# Patient Record
Sex: Female | Born: 1975 | Race: Black or African American | Hispanic: No | Marital: Single | Smoking: Never smoker
Health system: Southern US, Community
[De-identification: ages and names within clinical notes are randomized; demographics above are authoritative.]

## PROBLEM LIST (undated history)

## (undated) ENCOUNTER — Inpatient Hospital Stay (HOSPITAL_COMMUNITY): Payer: Self-pay

## (undated) DIAGNOSIS — Z789 Other specified health status: Secondary | ICD-10-CM

## (undated) HISTORY — PX: KNEE ARTHROSCOPY WITH ANTERIOR CRUCIATE LIGAMENT (ACL) REPAIR: SHX5644

## (undated) HISTORY — PX: WISDOM TOOTH EXTRACTION: SHX21

---

## 2015-12-06 ENCOUNTER — Emergency Department (HOSPITAL_COMMUNITY)
Admission: EM | Admit: 2015-12-06 | Discharge: 2015-12-07 | Disposition: A | Discharge status: Home / Self Care | Payer: Self-pay | Attending: Emergency Medicine | Admitting: Emergency Medicine

## 2015-12-06 ENCOUNTER — Emergency Department (HOSPITAL_COMMUNITY): Payer: Self-pay

## 2015-12-06 ENCOUNTER — Encounter (HOSPITAL_COMMUNITY): Payer: Self-pay | Admitting: *Deleted

## 2015-12-06 DIAGNOSIS — Z79899 Other long term (current) drug therapy: Secondary | ICD-10-CM | POA: Insufficient documentation

## 2015-12-06 DIAGNOSIS — Z3A15 15 weeks gestation of pregnancy: Secondary | ICD-10-CM | POA: Insufficient documentation

## 2015-12-06 DIAGNOSIS — Z3201 Encounter for pregnancy test, result positive: Secondary | ICD-10-CM

## 2015-12-06 DIAGNOSIS — O219 Vomiting of pregnancy, unspecified: Secondary | ICD-10-CM | POA: Insufficient documentation

## 2015-12-06 DIAGNOSIS — Z5181 Encounter for therapeutic drug level monitoring: Secondary | ICD-10-CM | POA: Insufficient documentation

## 2015-12-06 DIAGNOSIS — Z3491 Encounter for supervision of normal pregnancy, unspecified, first trimester: Secondary | ICD-10-CM

## 2015-12-06 LAB — COMPREHENSIVE METABOLIC PANEL
ALBUMIN: 3.9 g/dL (ref 3.5–5.0)
ALT: 13 U/L — AB (ref 14–54)
AST: 16 U/L (ref 15–41)
Alkaline Phosphatase: 34 U/L — ABNORMAL LOW (ref 38–126)
Anion gap: 8 (ref 5–15)
BILIRUBIN TOTAL: 1 mg/dL (ref 0.3–1.2)
CHLORIDE: 103 mmol/L (ref 101–111)
CO2: 23 mmol/L (ref 22–32)
CREATININE: 0.51 mg/dL (ref 0.44–1.00)
Calcium: 9.6 mg/dL (ref 8.9–10.3)
GFR calc Af Amer: >60 mL/min (ref >60)
GLUCOSE: 90 mg/dL (ref 65–99)
Potassium: 3.5 mmol/L (ref 3.5–5.1)
Sodium: 134 mmol/L — ABNORMAL LOW (ref 135–145)
Total Protein: 7.2 g/dL (ref 6.5–8.1)

## 2015-12-06 LAB — URINE MICROSCOPIC-ADD ON

## 2015-12-06 LAB — URINALYSIS, ROUTINE W REFLEX MICROSCOPIC
GLUCOSE, UA: NEGATIVE mg/dL
KETONES UR: 40 mg/dL — AB
LEUKOCYTES UA: NEGATIVE
Nitrite: NEGATIVE
PH: 6 (ref 5.0–8.0)
PROTEIN: 30 mg/dL — AB
Specific Gravity, Urine: 1.029 (ref 1.005–1.030)

## 2015-12-06 LAB — CBC
HEMATOCRIT: 35.6 % — AB (ref 36.0–46.0)
Hemoglobin: 12.3 g/dL (ref 12.0–15.0)
MCH: 31.5 pg (ref 26.0–34.0)
MCHC: 34.6 g/dL (ref 30.0–36.0)
MCV: 91.3 fL (ref 78.0–100.0)
PLATELETS: 387 10*3/uL (ref 150–400)
RBC: 3.9 MIL/uL (ref 3.87–5.11)
RDW: 13.2 % (ref 11.5–15.5)
WBC: 8 10*3/uL (ref 4.0–10.5)

## 2015-12-06 LAB — I-STAT BETA HCG BLOOD, ED (MC, WL, AP ONLY)

## 2015-12-06 LAB — I-STAT VENOUS BLOOD GAS, ED
Acid-base deficit: 3 mmol/L — ABNORMAL HIGH (ref 0.0–2.0)
Bicarbonate: 24.3 mEq/L — ABNORMAL HIGH (ref 20.0–24.0)
O2 SAT: 55 %
PCO2 VEN: 49.5 mmHg (ref 45.0–50.0)
TCO2: 26 mmol/L (ref 0–100)
pH, Ven: 7.299 (ref 7.250–7.300)
pO2, Ven: 32 mmHg (ref 31.0–45.0)

## 2015-12-06 LAB — RAPID URINE DRUG SCREEN, HOSP PERFORMED
Amphetamines: NOT DETECTED
BARBITURATES: NOT DETECTED
Benzodiazepines: NOT DETECTED
Cocaine: NOT DETECTED
Opiates: NOT DETECTED
Tetrahydrocannabinol: POSITIVE — AB

## 2015-12-06 LAB — I-STAT CG4 LACTIC ACID, ED: LACTIC ACID, VENOUS: 1.15 mmol/L (ref 0.5–1.9)

## 2015-12-06 LAB — LIPASE, BLOOD: LIPASE: 15 U/L (ref 11–51)

## 2015-12-06 MED ORDER — SODIUM CHLORIDE 0.9 % IV SOLN
1000.0000 mL | INTRAVENOUS | Status: DC
  Administered 2015-12-06: 1000 mL via INTRAVENOUS

## 2015-12-06 MED ORDER — ONDANSETRON HCL 4 MG/2ML IJ SOLN
4.0000 mg | Freq: Once | INTRAMUSCULAR | Status: AC
Start: 2015-12-06 — End: 2015-12-06
  Administered 2015-12-06: 4 mg via INTRAVENOUS
  Filled 2015-12-06: qty 2

## 2015-12-06 MED ORDER — SODIUM CHLORIDE 0.9 % IV SOLN: 1000.0000 mL | Freq: Once | INTRAVENOUS | Status: DC

## 2015-12-06 NOTE — ED Triage Notes (Signed)
Pt states she stop drinking a month ago and her "body in going crazy". Pt reports persistent nausea and has to smoke weed to help with the nausea. Pt believes there is something wrong with her liver and kidney. Pt denies diarrhea, blood in stools.

## 2015-12-06 NOTE — ED Provider Notes (Signed)
[facility] DEPT Provider Note   CSN: [id number] Arrival date & time: 12/06/15  [id number]     History   Chief Complaint Chief Complaint  Patient presents with  . Abdominal Pain  . Nausea    HPI Erin Gibbs is a 40 y.o. female. She relates that she was drinking heavily for many years and stopped drinking about one month ago. Since then, she has had ongoing problems with nausea and vomiting. There's also been some crampy abdominal pain. She denies constipation or diarrhea. She denies fever or chills. She does break out in sweats when she vomits. She denies using any other drugs. Last menses was 2 months ago but she states she is certain she is not pregnant. She has had urinary frequency and breast tenderness.  The history is provided by the patient.  Abdominal Pain      History reviewed. No pertinent past medical history.  There are no active problems to display for this patient.   History reviewed. No pertinent surgical history.  OB History    No data available       Home Medications    Prior to Admission medications   Not on File    Family History History reviewed. No pertinent family history.  Social History Social History  Substance Use Topics  . Smoking status: Never Smoker  . Smokeless tobacco: Never Used  . Alcohol use No     Allergies   Review of patient's allergies indicates no known allergies.   Review of Systems Review of Systems  Gastrointestinal: Positive for abdominal pain.  All other systems reviewed and are negative.    Physical Exam Updated Vital Signs BP 131/99   Pulse 88   Resp 18   SpO2 100%   Physical Exam  Nursing note and vitals reviewed.  40 year old female, resting comfortably and in no acute distress. Vital signs are significant for diastolic hypertension. Oxygen saturation is 100%, which is normal. Head is normocephalic and atraumatic. PERRLA, EOMI. Oropharynx is clear. Neck is nontender and supple without  adenopathy or JVD. Back is nontender and there is no CVA tenderness. Lungs are clear without rales, wheezes, or rhonchi. Chest is nontender. Heart has regular rate and rhythm without murmur. Abdomen is soft, flat, with mild to moderate tenderness across the lower abdomen. Uterine fundus is palpable up to the umbilicus. There are no other masses or hepatosplenomegaly and peristalsis is normoactive. Extremities have no cyanosis or edema, full range of motion is present. Skin is warm and dry without rash. Neurologic: Mental status is normal, cranial nerves are intact, there are no motor or sensory deficits.  ED Treatments / Results  Labs (all labs ordered are listed, but only abnormal results are displayed) Labs Reviewed  COMPREHENSIVE METABOLIC PANEL - Abnormal; Notable for the following:       Result Value   Sodium 134 (*)    BUN <5 (*)    ALT 13 (*)    Alkaline Phosphatase 34 (*)    All other components within normal limits  CBC - Abnormal; Notable for the following:    HCT 35.6 (*)    All other components within normal limits  URINALYSIS, ROUTINE W REFLEX MICROSCOPIC (NOT AT [facility]) - Abnormal; Notable for the following:    Color, Urine AMBER (*)    APPearance CLOUDY (*)    Hgb urine dipstick TRACE (*)    Bilirubin Urine SMALL (*)    Ketones, ur 40 (*)    Protein, ur  30 (*)    All other components within normal limits  URINE RAPID DRUG SCREEN, HOSP PERFORMED - Abnormal; Notable for the following:    Tetrahydrocannabinol POSITIVE (*)    All other components within normal limits  URINE MICROSCOPIC-ADD ON - Abnormal; Notable for the following:    Squamous Epithelial / LPF 6-30 (*)    Bacteria, UA FEW (*)    All other components within normal limits  I-STAT BETA HCG BLOOD, ED ([facility], [facility], [facility] ONLY) - Abnormal; Notable for the following:    I-stat hCG, quantitative >2,000.0 (*)    All other components within normal limits  I-STAT VENOUS BLOOD GAS, ED - Abnormal; Notable for the  following:    Bicarbonate 24.3 (*)    Acid-base deficit 3.0 (*)    All other components within normal limits  LIPASE, BLOOD  HCG, QUANTITATIVE, PREGNANCY  I-STAT CG4 LACTIC ACID, ED  I-STAT CG4 LACTIC ACID, ED    Radiology US Ob Limited  Result Date: 12/07/2015 CLINICAL DATA:  Unknown last menstrual period. Unsure dates. Positive pregnancy test. Nausea. EXAM: LIMITED OBSTETRIC ULTRASOUND FINDINGS: Number of Fetuses:  1 Heart Rate:  149 bpm Movement:  Fetal movement is observed. Presentation: Fetus is in variable presentation during the examination. Placental Location: Placenta is fundal and extends anteriorly and posteriorly. Previa: No previa. Amniotic Fluid (Subjective): Within normal limits. Amniotic fluid index is 149 cm. BPD:  3.03cm 15w  4d MATERNAL FINDINGS: Cervix:  Appears closed. Uterus/Adnexae: Limited visualization. Fibroids are demonstrated in the lower uterine segment near the cervix. Anterior fibroid measures 5.1 cm maximal diameter and posterior fibroid measures 5.6 cm maximal diameter. Ovaries are not visualized. IMPRESSION: A single intrauterine pregnancy is present. Complete fetal measurements are not obtained for dating but limited biparietal diameter measurements suggest 15 weeks 4 days gestational age. No acute complication is identified on limited imaging. Lower uterine segment fibroids. This exam is performed on an emergent basis and does not comprehensively evaluate fetal size, dating, or anatomy; follow-up complete OB US should be considered if further fetal assessment is warranted. Electronically Signed   By: Lucienne Capers M.D.   On: 12/07/2015 00:09    Procedures Procedures (including critical care time)  Medications Ordered in ED Medications - No data to display   Initial Impression / Assessment and Plan / ED Course  I have reviewed the triage vital signs and the nursing notes.  Pertinent labs & imaging results that were available during my care of the  patient were reviewed by me and considered in my medical decision making (see chart for details).  Clinical Course   Pregnancy based on positive i-STAT hCG. All symptoms are typical of pregnancy, but patient insists she is not pregnant. I'm concerned about her blood pressure. She'll be sent for ultrasound to establish gestational age. In the meantime, she is given IV fluids, ondansetron. She has no old records and the [facility] system.  Ultrasound confirms intrauterine gestation at 15 weeks, 3 days. She is referred to women's clinic for follow-up. She is given prescriptions for prenatal vitamins and ondansetron.  Final Clinical Impressions(s) / ED Diagnoses   Final diagnoses:  Positive pregnancy test  First trimester pregnancy  Nausea and vomiting during pregnancy    New Prescriptions New Prescriptions   ONDANSETRON (ZOFRAN) 4 MG TABLET    Take 1 tablet (4 mg total) by mouth every 6 (six) hours as needed for nausea or vomiting.   PRENATAL MULTIVIT-MIN-FE-FA (PRENATAL VITAMINS) 0.8 MG TABLET  Take 1 tablet by mouth daily.     Delora Fuel, MD [id number]

## 2015-12-06 NOTE — ED Notes (Signed)
Patient transported to Ultrasound 

## 2015-12-07 LAB — HCG, QUANTITATIVE, PREGNANCY: HCG, BETA CHAIN, QUANT, S: 44720 m[IU]/mL — AB (ref <5)

## 2015-12-07 MED ORDER — ONDANSETRON HCL 4 MG PO TABS: 4.0000 mg | ORAL_TABLET | Freq: Four times a day (QID) | ORAL | 0 refills | Status: DC | PRN

## 2015-12-07 MED ORDER — PRENATAL VITAMINS 0.8 MG PO TABS: 1.0000 | ORAL_TABLET | Freq: Every day | ORAL | 0 refills | Status: DC

## 2016-01-06 ENCOUNTER — Encounter (HOSPITAL_COMMUNITY): Payer: Self-pay | Admitting: *Deleted

## 2016-01-06 ENCOUNTER — Inpatient Hospital Stay (HOSPITAL_COMMUNITY)
Admission: AD | Admit: 2016-01-06 | Discharge: 2016-01-06 | Disposition: A | Discharge status: Home / Self Care | Payer: Medicaid Other | Source: Ambulatory Visit | Attending: Obstetrics & Gynecology | Admitting: Obstetrics & Gynecology

## 2016-01-06 ENCOUNTER — Inpatient Hospital Stay (HOSPITAL_COMMUNITY): Payer: Medicaid Other

## 2016-01-06 DIAGNOSIS — O26899 Other specified pregnancy related conditions, unspecified trimester: Secondary | ICD-10-CM

## 2016-01-06 DIAGNOSIS — O09512 Supervision of elderly primigravida, second trimester: Secondary | ICD-10-CM

## 2016-01-06 DIAGNOSIS — O23592 Infection of other part of genital tract in pregnancy, second trimester: Secondary | ICD-10-CM | POA: Diagnosis not present

## 2016-01-06 DIAGNOSIS — R109 Unspecified abdominal pain: Secondary | ICD-10-CM

## 2016-01-06 DIAGNOSIS — O9989 Other specified diseases and conditions complicating pregnancy, childbirth and the puerperium: Secondary | ICD-10-CM

## 2016-01-06 DIAGNOSIS — R1032 Left lower quadrant pain: Secondary | ICD-10-CM | POA: Insufficient documentation

## 2016-01-06 DIAGNOSIS — D259 Leiomyoma of uterus, unspecified: Secondary | ICD-10-CM

## 2016-01-06 DIAGNOSIS — F1210 Cannabis abuse, uncomplicated: Secondary | ICD-10-CM

## 2016-01-06 DIAGNOSIS — O26872 Cervical shortening, second trimester: Secondary | ICD-10-CM

## 2016-01-06 DIAGNOSIS — O3410 Maternal care for benign tumor of corpus uteri, unspecified trimester: Secondary | ICD-10-CM

## 2016-01-06 DIAGNOSIS — N760 Acute vaginitis: Secondary | ICD-10-CM

## 2016-01-06 DIAGNOSIS — O3412 Maternal care for benign tumor of corpus uteri, second trimester: Secondary | ICD-10-CM | POA: Insufficient documentation

## 2016-01-06 DIAGNOSIS — O99330 Smoking (tobacco) complicating pregnancy, unspecified trimester: Secondary | ICD-10-CM | POA: Diagnosis present

## 2016-01-06 DIAGNOSIS — Z5900 Homelessness unspecified: Secondary | ICD-10-CM

## 2016-01-06 DIAGNOSIS — B9689 Other specified bacterial agents as the cause of diseases classified elsewhere: Secondary | ICD-10-CM

## 2016-01-06 DIAGNOSIS — O09529 Supervision of elderly multigravida, unspecified trimester: Secondary | ICD-10-CM

## 2016-01-06 DIAGNOSIS — O09522 Supervision of elderly multigravida, second trimester: Secondary | ICD-10-CM

## 2016-01-06 DIAGNOSIS — O0932 Supervision of pregnancy with insufficient antenatal care, second trimester: Secondary | ICD-10-CM | POA: Diagnosis not present

## 2016-01-06 DIAGNOSIS — O99322 Drug use complicating pregnancy, second trimester: Secondary | ICD-10-CM | POA: Insufficient documentation

## 2016-01-06 DIAGNOSIS — Z3A20 20 weeks gestation of pregnancy: Secondary | ICD-10-CM

## 2016-01-06 DIAGNOSIS — O99332 Smoking (tobacco) complicating pregnancy, second trimester: Secondary | ICD-10-CM

## 2016-01-06 LAB — URINALYSIS, ROUTINE W REFLEX MICROSCOPIC
Bilirubin Urine: NEGATIVE
GLUCOSE, UA: NEGATIVE mg/dL
HGB URINE DIPSTICK: NEGATIVE
KETONES UR: NEGATIVE mg/dL
LEUKOCYTES UA: NEGATIVE
Nitrite: NEGATIVE
PROTEIN: 30 mg/dL — AB
Specific Gravity, Urine: 1.025 (ref 1.005–1.030)
pH: 6 (ref 5.0–8.0)

## 2016-01-06 LAB — CBC
HCT: 29.3 % — ABNORMAL LOW (ref 36.0–46.0)
Hemoglobin: 10.5 g/dL — ABNORMAL LOW (ref 12.0–15.0)
MCH: 31.8 pg (ref 26.0–34.0)
MCHC: 35.8 g/dL (ref 30.0–36.0)
MCV: 88.8 fL (ref 78.0–100.0)
PLATELETS: 328 10*3/uL (ref 150–400)
RBC: 3.3 MIL/uL — AB (ref 3.87–5.11)
RDW: 13.4 % (ref 11.5–15.5)
WBC: 9.9 10*3/uL (ref 4.0–10.5)

## 2016-01-06 LAB — DIFFERENTIAL
BASOS ABS: 0 10*3/uL (ref 0.0–0.1)
Basophils Relative: 0 %
Eosinophils Absolute: 0.1 10*3/uL (ref 0.0–0.7)
Eosinophils Relative: 1 %
LYMPHS PCT: 21 %
Lymphs Abs: 2.1 10*3/uL (ref 0.7–4.0)
Monocytes Absolute: 0.5 10*3/uL (ref 0.1–1.0)
Monocytes Relative: 5 %
NEUTROS ABS: 7.3 10*3/uL (ref 1.7–7.7)
NEUTROS PCT: 73 %

## 2016-01-06 LAB — HEPATITIS B SURFACE ANTIGEN: Hepatitis B Surface Ag: NEGATIVE

## 2016-01-06 LAB — URINE MICROSCOPIC-ADD ON
BACTERIA UA: NONE SEEN
RBC / HPF: NONE SEEN RBC/hpf (ref 0–5)
WBC UA: NONE SEEN WBC/hpf (ref 0–5)

## 2016-01-06 LAB — ABO/RH: ABO/RH(D): O POS

## 2016-01-06 LAB — WET PREP, GENITAL
Sperm: NONE SEEN
Trich, Wet Prep: NONE SEEN
YEAST WET PREP: NONE SEEN

## 2016-01-06 MED ORDER — ACETAMINOPHEN 500 MG PO TABS
1000.0000 mg | ORAL_TABLET | Freq: Once | ORAL | Status: AC
  Administered 2016-01-06: 1000 mg via ORAL
  Filled 2016-01-06: qty 2

## 2016-01-06 MED ORDER — METRONIDAZOLE 500 MG PO TABS: 500.0000 mg | ORAL_TABLET | Freq: Two times a day (BID) | ORAL | 0 refills | Status: DC

## 2016-01-06 NOTE — MAU Note (Signed)
Patient presents with lower abdominal cramping for the last 2 hrs

## 2016-01-06 NOTE — Discharge Instructions (Signed)
Bacterial Vaginosis Bacterial vaginosis is a vaginal infection that occurs when the normal balance of bacteria in the vagina is disrupted. It results from an overgrowth of certain bacteria. This is the most common vaginal infection in women of childbearing age. Treatment is important to prevent complications, especially in pregnant women, as it can cause a premature delivery. CAUSES  Bacterial vaginosis is caused by an increase in harmful bacteria that are normally present in smaller amounts in the vagina. Several different kinds of bacteria can cause bacterial vaginosis. However, the reason that the condition develops is not fully understood. RISK FACTORS Certain activities or behaviors can put you at an increased risk of developing bacterial vaginosis, including:  Having a new sex partner or multiple sex partners.  Douching.  Using an intrauterine device (IUD) for contraception. Women do not get bacterial vaginosis from toilet seats, bedding, swimming pools, or contact with objects around them. SIGNS AND SYMPTOMS  Some women with bacterial vaginosis have no signs or symptoms. Common symptoms include:  Grey vaginal discharge.  A fishlike odor with discharge, especially after sexual intercourse.  Itching or burning of the vagina and vulva.  Burning or pain with urination. DIAGNOSIS  Your health care provider will take a medical history and examine the vagina for signs of bacterial vaginosis. A sample of vaginal fluid may be taken. Your health care provider will look at this sample under a microscope to check for bacteria and abnormal cells. A vaginal pH test may also be done.  TREATMENT  Bacterial vaginosis may be treated with antibiotic medicines. These may be given in the form of a pill or a vaginal cream. A second round of antibiotics may be prescribed if the condition comes back after treatment. Because bacterial vaginosis increases your risk for sexually transmitted diseases, getting  treated can help reduce your risk for chlamydia, gonorrhea, HIV, and herpes. HOME CARE INSTRUCTIONS   Only take over-the-counter or prescription medicines as directed by your health care provider.  If antibiotic medicine was prescribed, take it as directed. Make sure you finish it even if you start to feel better.  Tell all sexual partners that you have a vaginal infection. They should see their health care provider and be treated if they have problems, such as a mild rash or itching.  During treatment, it is important that you follow these instructions:  Avoid sexual activity or use condoms correctly.  Do not douche.  Avoid alcohol as directed by your health care provider.  Avoid breastfeeding as directed by your health care provider. SEEK MEDICAL CARE IF:   Your symptoms are not improving after 3 days of treatment.  You have increased discharge or pain.  You have a fever. MAKE SURE YOU:   Understand these instructions.  Will watch your condition.  Will get help right away if you are not doing well or get worse. FOR MORE INFORMATION  Centers for Disease Control and Prevention, Division of STD Prevention: [employer] American Sexual Health Association (ASHA): www.ashastd.org    This information is not intended to replace advice given to you by your health care provider. Make sure you discuss any questions you have with your health care provider.   Document Released: 04/10/2005 Document Revised: 05/01/2014 Document Reviewed: 11/20/2012 Elsevier Interactive Patient Education 2016 [place] of Pregnancy The second trimester is from week 13 through week 28, months 4 through 6. The second trimester is often a time when you feel your best. Your body has also adjusted to being  pregnant, and you begin to feel better physically. Usually, morning sickness has lessened or quit completely, you may have more energy, and you may have an increase in appetite. The  second trimester is also a time when the fetus is growing rapidly. At the end of the sixth month, the fetus is about 9 inches long and weighs about 1 pounds. You will likely begin to feel the baby move (quickening) between 18 and 20 weeks of the pregnancy. BODY CHANGES Your body goes through many changes during pregnancy. The changes vary from woman to woman.   Your weight will continue to increase. You will notice your lower abdomen bulging out.  You may begin to get stretch marks on your hips, abdomen, and breasts.  You may develop headaches that can be relieved by medicines approved by your health care provider.  You may urinate more often because the fetus is pressing on your bladder.  You may develop or continue to have heartburn as a result of your pregnancy.  You may develop constipation because certain hormones are causing the muscles that push waste through your intestines to slow down.  You may develop hemorrhoids or swollen, bulging veins (varicose veins).  You may have back pain because of the weight gain and pregnancy hormones relaxing your joints between the bones in your pelvis and as a result of a shift in weight and the muscles that support your balance.  Your breasts will continue to grow and be tender.  Your gums may bleed and may be sensitive to brushing and flossing.  Dark spots or blotches (chloasma, mask of pregnancy) may develop on your face. This will likely fade after the baby is born.  A dark line from your belly button to the pubic area (linea nigra) may appear. This will likely fade after the baby is born.  You may have changes in your hair. These can include thickening of your hair, rapid growth, and changes in texture. Some women also have hair loss during or after pregnancy, or hair that feels dry or thin. Your hair will most likely return to normal after your baby is born. WHAT TO EXPECT AT YOUR PRENATAL VISITS During a routine prenatal visit:  You  will be weighed to make sure you and the fetus are growing normally.  Your blood pressure will be taken.  Your abdomen will be measured to track your baby's growth.  The fetal heartbeat will be listened to.  Any test results from the previous visit will be discussed. Your health care provider may ask you:  How you are feeling.  If you are feeling the baby move.  If you have had any abnormal symptoms, such as leaking fluid, bleeding, severe headaches, or abdominal cramping.  If you are using any tobacco products, including cigarettes, chewing tobacco, and electronic cigarettes.  If you have any questions. Other tests that may be performed during your second trimester include:  Blood tests that check for:  Low iron levels (anemia).  Gestational diabetes (between 24 and 28 weeks).  Rh antibodies.  Urine tests to check for infections, diabetes, or protein in the urine.  An ultrasound to confirm the proper growth and development of the baby.  An amniocentesis to check for possible genetic problems.  Fetal screens for spina bifida and Down syndrome.  HIV (human immunodeficiency virus) testing. Routine prenatal testing includes screening for HIV, unless you choose not to have this test. HOME CARE INSTRUCTIONS   Avoid all smoking, herbs, alcohol, and unprescribed  drugs. These chemicals affect the formation and growth of the baby.  Do not use any tobacco products, including cigarettes, chewing tobacco, and electronic cigarettes. If you need help quitting, ask your health care provider. You may receive counseling support and other resources to help you quit.  Follow your health care provider's instructions regarding medicine use. There are medicines that are either safe or unsafe to take during pregnancy.  Exercise only as directed by your health care provider. Experiencing uterine cramps is a good sign to stop exercising.  Continue to eat regular, healthy meals.  Wear a good  support bra for breast tenderness.  Do not use hot tubs, steam rooms, or saunas.  Wear your seat belt at all times when driving.  Avoid raw meat, uncooked cheese, cat litter boxes, and soil used by cats. These carry germs that can cause birth defects in the baby.  Take your prenatal vitamins.  Take 1500-2000 mg of calcium daily starting at the 20th week of pregnancy until you deliver your baby.  Try taking a stool softener (if your health care provider approves) if you develop constipation. Eat more high-fiber foods, such as fresh vegetables or fruit and whole grains. Drink plenty of fluids to keep your urine clear or pale yellow.  Take warm sitz baths to soothe any pain or discomfort caused by hemorrhoids. Use hemorrhoid cream if your health care provider approves.  If you develop varicose veins, wear support hose. Elevate your feet for 15 minutes, 3-4 times a day. Limit salt in your diet.  Avoid heavy lifting, wear low heel shoes, and practice good posture.  Rest with your legs elevated if you have leg cramps or low back pain.  Visit your dentist if you have not gone yet during your pregnancy. Use a soft toothbrush to brush your teeth and be gentle when you floss.  A sexual relationship may be continued unless your health care provider directs you otherwise.  Continue to go to all your prenatal visits as directed by your health care provider. SEEK MEDICAL CARE IF:   You have dizziness.  You have mild pelvic cramps, pelvic pressure, or nagging pain in the abdominal area.  You have persistent nausea, vomiting, or diarrhea.  You have a bad smelling vaginal discharge.  You have pain with urination. SEEK IMMEDIATE MEDICAL CARE IF:   You have a fever.  You are leaking fluid from your vagina.  You have spotting or bleeding from your vagina.  You have severe abdominal cramping or pain.  You have rapid weight gain or loss.  You have shortness of breath with chest  pain.  You notice sudden or extreme swelling of your face, hands, ankles, feet, or legs.  You have not felt your baby move in over an hour.  You have severe headaches that do not go away with medicine.  You have vision changes.   This information is not intended to replace advice given to you by your health care provider. Make sure you discuss any questions you have with your health care provider.   Document Released: 04/04/2001 Document Revised: 05/01/2014 Document Reviewed: 06/11/2012 Elsevier Interactive Patient Education 2016 [employer] Uterine Fibroids Uterine fibroids are tissue masses (tumors) that can develop in the womb (uterus). They are also called leiomyomas. This type of tumor is not cancerous (benign) and does not spread to other parts of the body outside of the pelvic area, which is between the hip bones. Occasionally, fibroids may develop in the fallopian tubes, in  the cervix, or on the support structures (ligaments) that surround the uterus. You can have one or many fibroids. Fibroids can vary in size, weight, and where they grow in the uterus. Some can become quite large. Most fibroids do not require medical treatment. CAUSES A fibroid can develop when a single uterine cell keeps growing (replicating). Most cells in the human body have a control mechanism that keeps them from replicating without control. SIGNS AND SYMPTOMS Symptoms may include:   Heavy bleeding during your period.  Bleeding or spotting between periods.  Pelvic pain and pressure.  Bladder problems, such as needing to urinate more often (urinary frequency) or urgently.  Inability to reproduce offspring (infertility).  Miscarriages. DIAGNOSIS Uterine fibroids are diagnosed through a physical exam. Your health care provider may feel the lumpy tumors during a pelvic exam. Ultrasonography and an MRI may be done to determine the size, location, and number of fibroids. TREATMENT Treatment may  include:  Watchful waiting. This involves getting the fibroid checked by your health care provider to see if it grows or shrinks. Follow your health care provider's recommendations for how often to have this checked.  Hormone medicines. These can be taken by mouth or given through an intrauterine device (IUD).  Surgery.  Removing the fibroids (myomectomy) or the uterus (hysterectomy).  Removing blood supply to the fibroids (uterine artery embolization). If fibroids interfere with your fertility and you want to become pregnant, your health care provider may recommend having the fibroids removed.  HOME CARE INSTRUCTIONS  Keep all follow-up visits as directed by your health care provider. This is important.  Take medicines only as directed by your health care provider.  If you were prescribed a hormone treatment, take the hormone medicines exactly as directed.  Do not take aspirin, because it can cause bleeding.  Ask your health care provider about taking iron pills and increasing the amount of dark green, leafy vegetables in your diet. These actions can help to boost your blood iron levels, which may be affected by heavy menstrual bleeding.  Pay close attention to your period and tell your health care provider about any changes, such as:  Increased blood flow that requires you to use more pads or tampons than usual per month.  A change in the number of days that your period lasts per month.  A change in symptoms that are associated with your period, such as abdominal cramping or back pain. SEEK MEDICAL CARE IF:  You have pelvic pain, back pain, or abdominal cramps that cannot be controlled with medicines.  You have an increase in bleeding between and during periods.  You soak tampons or pads in a half hour or less.  You feel lightheaded, extra tired, or weak. SEEK IMMEDIATE MEDICAL CARE IF:  You faint.  You have a sudden increase in pelvic pain.   This information is not  intended to replace advice given to you by your health care provider. Make sure you discuss any questions you have with your health care provider.   Document Released: 04/07/2000 Document Revised: 05/01/2014 Document Reviewed: 10/07/2013 Elsevier Interactive Patient Education [employer].

## 2016-01-06 NOTE — MAU Provider Note (Signed)
History     CSN: [id number]  Arrival date and time: 01/06/16 1800   None     Chief Complaint  Patient presents with  . Abdominal Pain   Erin Gibbs is a 40 y.o. G1P0 at [redacted]w[redacted]d presenting with onset continuous crampy lower abdominal pain since 2 hr PTA. She also has pelvic pressure. No antecedent intercourse. No vaginal bleeding or leaking. Good FM.  Reports heavy alcohol intake ("self-medicating for depression") until she quit 10/27/2015. Unaware of pregnancy until 11/05/2015 when seen in ED for nausea and vomiting. "Not happy" about pregnancy. Homeless and living in [place] but has NOB appointment at [facility] 01/10/2016.  Best EGA by outside [place] BPD measurement.     Abdominal Pain  This is a new problem. The current episode started today. The onset quality is sudden. The problem occurs constantly. The most recent episode lasted 2 hours. The problem has been unchanged. The pain is located in the LLQ and RLQ. The pain is at a severity of 7/10. The pain is moderate. The quality of the pain is cramping. The abdominal pain does not radiate. Associated symptoms include frequency and headaches. Pertinent negatives include no constipation, diarrhea, dysuria, fever, hematuria, nausea or vomiting. Nothing aggravates the pain. The pain is relieved by nothing. She has tried nothing for the symptoms.      History reviewed. No pertinent past medical history.  Past Surgical History:  Procedure Laterality Date  . KNEE ARTHROSCOPY WITH ANTERIOR CRUCIATE LIGAMENT (ACL) REPAIR      History reviewed. No pertinent family history.  Social History  Substance Use Topics  . Smoking status: Never Smoker  . Smokeless tobacco: Never Used  . Alcohol use No    Allergies: No Known Allergies  Prescriptions Prior to Admission  Medication Sig Dispense Refill Last Dose  . ondansetron (ZOFRAN) 4 MG tablet Take 1 tablet (4 mg total) by mouth every 6 (six) hours as needed for nausea or vomiting. 20 tablet 0    . Prenatal Multivit-Min-Fe-FA (PRENATAL VITAMINS) 0.8 MG tablet Take 1 tablet by mouth daily. 30 tablet 0     Review of Systems  Constitutional: Negative for fever and malaise/fatigue.  Gastrointestinal: Positive for abdominal pain. Negative for blood in stool, constipation, diarrhea, heartburn, nausea and vomiting.  Genitourinary: Positive for frequency. Negative for dysuria, flank pain, hematuria and urgency.  Musculoskeletal: Positive for back pain.  Neurological: Positive for headaches.  Psychiatric/Behavioral: Positive for depression and substance abuse. Negative for suicidal ideas.       Marijuana   Physical Exam   Blood pressure 119/76, pulse 85, temperature 99.1 F (37.3 C), temperature source Oral, resp. rate 16.  Physical Exam  Nursing note and vitals reviewed. Constitutional: She is oriented to person, place, and time. She appears well-developed.  HENT:  Head: Normocephalic.  Eyes: Pupils are equal, round, and reactive to light.  Neck: Neck supple.  Cardiovascular: Normal rate, regular rhythm and normal heart sounds.   GI: Soft. There is tenderness. There is no rebound and no guarding.  DT FHR 155 TTP lower abdomne  Genitourinary:  Genitourinary Comments: NEFG Vagina with scant white discharge Cx posterior, firm, closed, slighlty short 2-2.5cm  Fibroid palpable ant to cx.   Musculoskeletal: Normal range of motion.  Neurological: She is alert and oriented to person, place, and time.  Skin: Skin is warm and dry.  Psychiatric: She has a normal mood and affect.    MAU Course  Procedures Results for orders placed or performed during the hospital  encounter of 01/06/16 (from the past 24 hour(s))  Urinalysis, Routine w reflex microscopic (not at [facility])     Status: Abnormal   Collection Time: 01/06/16  6:05 PM  Result Value Ref Range   Color, Urine YELLOW YELLOW   APPearance CLEAR CLEAR   Specific Gravity, Urine 1.025 1.005 - 1.030   pH 6.0 5.0 - 8.0   Glucose, UA  NEGATIVE NEGATIVE mg/dL   Hgb urine dipstick NEGATIVE NEGATIVE   Bilirubin Urine NEGATIVE NEGATIVE   Ketones, ur NEGATIVE NEGATIVE mg/dL   Protein, ur 30 (A) NEGATIVE mg/dL   Nitrite NEGATIVE NEGATIVE   Leukocytes, UA NEGATIVE NEGATIVE  Urine microscopic-add on     Status: Abnormal   Collection Time: 01/06/16  6:05 PM  Result Value Ref Range   Squamous Epithelial / LPF 0-5 (A) NONE SEEN   WBC, UA NONE SEEN 0 - 5 WBC/hpf   RBC / HPF NONE SEEN 0 - 5 RBC/hpf   Bacteria, UA NONE SEEN NONE SEEN   Crystals CA OXALATE CRYSTALS (A) NEGATIVE   Urine-Other MUCOUS PRESENT   CBC     Status: Abnormal   Collection Time: 01/06/16  6:52 PM  Result Value Ref Range   WBC 9.9 4.0 - 10.5 K/uL   RBC 3.30 (L) 3.87 - 5.11 MIL/uL   Hemoglobin 10.5 (L) 12.0 - 15.0 g/dL   HCT 29.3 (L) 36.0 - 46.0 %   MCV 88.8 78.0 - 100.0 fL   MCH 31.8 26.0 - 34.0 pg   MCHC 35.8 30.0 - 36.0 g/dL   RDW 13.4 11.5 - 15.5 %   Platelets 328 150 - 400 K/uL  Differential     Status: None   Collection Time: 01/06/16  6:52 PM  Result Value Ref Range   Neutrophils Relative % 73 %   Neutro Abs 7.3 1.7 - 7.7 K/uL   Lymphocytes Relative 21 %   Lymphs Abs 2.1 0.7 - 4.0 K/uL   Monocytes Relative 5 %   Monocytes Absolute 0.5 0.1 - 1.0 K/uL   Eosinophils Relative 1 %   Eosinophils Absolute 0.1 0.0 - 0.7 K/uL   Basophils Relative 0 %   Basophils Absolute 0.0 0.0 - 0.1 K/uL  ABO/Rh     Status: None (Preliminary result)   Collection Time: 01/06/16  6:57 PM  Result Value Ref Range   ABO/RH(D) O POS   Wet prep, genital     Status: Abnormal   Collection Time: 01/06/16  8:05 PM  Result Value Ref Range   Yeast Wet Prep HPF POC NONE SEEN NONE SEEN   Trich, Wet Prep NONE SEEN NONE SEEN   Clue Cells Wet Prep HPF POC PRESENT (A) NONE SEEN   WBC, Wet Prep HPF POC FEW (A) NONE SEEN   Sperm NONE SEEN   OB panel, ABO, urine culture, GC/CT sent  MDM Limited US to check CL and placenta/fibroid locations. Suspect pain d/t degenerating  fibroids and will try acetaminophen, consider short course NSAIDs  if needed. Care assumed by [place] CNM at 2120  Assessment and Plan  40 yo G1 at [redacted]w[redacted]d 1. Abdominal pain affecting pregnancy   2. Insufficient prenatal care in second trimester   3. AMA (advanced maternal age) multigravida 25+, second trimester   4. Uterine leiomyoma, unspecified location   5. Marijuana abuse   6. Tobacco use during pregnancy, second trimester   7. Homelessness   8. Short cervical length during pregnancy in second trimester   9. BV (bacterial vaginosis)    .  me  Follow-up Information    [facility] Follow up on 01/10/2016.   Why:  Keep your scheduled appointment Contact information: [address] [place] [address] [phone]          Erin Gibbs 01/06/2016, 6:29 PM

## 2016-01-06 NOTE — Progress Notes (Signed)
CSW spoke with pt via phone re: her homelessness.  Pt was living in a rental that was recently sold and was unable to secure alternative housing within the 30 day notice she was given.  Pt states that she has been living in her car x 3 weeks and is trying to save money from her full-time job in order to rent another place.  Per pt, the FOB lives in [place] and she got into a big argument with him today.  Per pt, she does not plan on continuing her relationship with FOB.  Pt states that her mother/brother live in [place], but are not supportive.  In addition to her work income, pt has [place] and Medicaid.  She is not interested in a hotel room due to the cost, and would rather sleep in her car and save her money for an apartment.  Pt familiar with various shelters/resources in the area, and CSW prepared and faxed additional resources to her in the MAU.  Emotional support provided.

## 2016-01-07 ENCOUNTER — Inpatient Hospital Stay (HOSPITAL_COMMUNITY)
Admission: AD | Admit: 2016-01-07 | Discharge: 2016-01-07 | Disposition: A | Discharge status: Home / Self Care | Payer: Medicaid Other | Source: Ambulatory Visit | Attending: Obstetrics and Gynecology | Admitting: Obstetrics and Gynecology

## 2016-01-07 ENCOUNTER — Encounter (HOSPITAL_COMMUNITY): Payer: Self-pay | Admitting: *Deleted

## 2016-01-07 DIAGNOSIS — O3412 Maternal care for benign tumor of corpus uteri, second trimester: Secondary | ICD-10-CM | POA: Diagnosis not present

## 2016-01-07 DIAGNOSIS — R109 Unspecified abdominal pain: Secondary | ICD-10-CM | POA: Insufficient documentation

## 2016-01-07 DIAGNOSIS — Z3A20 20 weeks gestation of pregnancy: Secondary | ICD-10-CM | POA: Insufficient documentation

## 2016-01-07 DIAGNOSIS — O26899 Other specified pregnancy related conditions, unspecified trimester: Secondary | ICD-10-CM

## 2016-01-07 DIAGNOSIS — D259 Leiomyoma of uterus, unspecified: Secondary | ICD-10-CM | POA: Diagnosis not present

## 2016-01-07 DIAGNOSIS — O9989 Other specified diseases and conditions complicating pregnancy, childbirth and the puerperium: Secondary | ICD-10-CM

## 2016-01-07 HISTORY — DX: Other specified health status: Z78.9

## 2016-01-07 LAB — URINALYSIS, ROUTINE W REFLEX MICROSCOPIC
BILIRUBIN URINE: NEGATIVE
Glucose, UA: NEGATIVE mg/dL
Ketones, ur: NEGATIVE mg/dL
Leukocytes, UA: NEGATIVE
Nitrite: NEGATIVE
PH: 7 (ref 5.0–8.0)
Protein, ur: NEGATIVE mg/dL
SPECIFIC GRAVITY, URINE: 1.01 (ref 1.005–1.030)

## 2016-01-07 LAB — CULTURE, OB URINE

## 2016-01-07 LAB — URINE MICROSCOPIC-ADD ON
Bacteria, UA: NONE SEEN
RBC / HPF: NONE SEEN RBC/hpf (ref 0–5)
WBC, UA: NONE SEEN WBC/hpf (ref 0–5)

## 2016-01-07 LAB — HIV ANTIBODY (ROUTINE TESTING W REFLEX): HIV Screen 4th Generation wRfx: NONREACTIVE

## 2016-01-07 LAB — RPR: RPR Ser Ql: NONREACTIVE

## 2016-01-07 LAB — GC/CHLAMYDIA PROBE AMP (~~LOC~~) NOT AT ARMC
Chlamydia: NEGATIVE
NEISSERIA GONORRHEA: NEGATIVE

## 2016-01-07 LAB — RUBELLA SCREEN: RUBELLA: 3.8 {index} (ref >0.99)

## 2016-01-07 MED ORDER — IBUPROFEN 800 MG PO TABS: 800.0000 mg | ORAL_TABLET | Freq: Three times a day (TID) | ORAL | 0 refills | Status: DC

## 2016-01-07 MED ORDER — IBUPROFEN 800 MG PO TABS
800.0000 mg | ORAL_TABLET | Freq: Once | ORAL | Status: AC
  Administered 2016-01-07: 800 mg via ORAL
  Filled 2016-01-07: qty 1

## 2016-01-07 NOTE — MAU Provider Note (Signed)
  History     CSN: [id number]  Arrival date and time: 01/07/16 1754   First Provider Initiated Contact with Patient 01/07/16 1845      Chief Complaint  Patient presents with  . Abdominal Pain   HPI Ms Garofano is a 40yo G1 @ 20.1wks who presents for eval of low, mid abd pain. Denies bldg or leak; no dysuria. Was here for the same approx 24hr ago and was dx by U/S w/ fibroids, 2 of which were 7cm and 4cm. Took Tylenol without relief today. She starts [facility] at [facility] on 9/18.  OB History    Gravida Para Term Preterm AB Living   1         0   SAB TAB Ectopic Multiple Live Births                  Past Medical History:  Diagnosis Date  . Medical history non-contributory     Past Surgical History:  Procedure Laterality Date  . KNEE ARTHROSCOPY WITH ANTERIOR CRUCIATE LIGAMENT (ACL) REPAIR      History reviewed. No pertinent family history.  Social History  Substance Use Topics  . Smoking status: Never Smoker  . Smokeless tobacco: Never Used  . Alcohol use No    Allergies: No Known Allergies  Prescriptions Prior to Admission  Medication Sig Dispense Refill Last Dose  . metroNIDAZOLE (FLAGYL) 500 MG tablet Take 1 tablet (500 mg total) by mouth 2 (two) times daily. 14 tablet 0 01/07/2016 at Unknown time  . Prenatal Multivit-Min-Fe-FA (PRENATAL VITAMINS) 0.8 MG tablet Take 1 tablet by mouth daily. 30 tablet 0 01/07/2016 at Unknown time    ROS No other pertinents other than what is listed in HPI Physical Exam   Blood pressure 118/79, pulse 90, temperature 98.3 F (36.8 C), temperature source Oral, resp. rate 16, SpO2 100 %.  Physical Exam  Constitutional: She is oriented to person, place, and time. She appears well-developed.  HENT:  Head: Normocephalic.  Neck: Normal range of motion.  Cardiovascular: Normal rate.   Respiratory: Effort normal.  GI:  FHTs dopplered 164 Abd soft, gravid  Genitourinary: Vagina normal.  Genitourinary Comments: Cx C/L  Musculoskeletal:  Normal range of motion.  Neurological: She is alert and oriented to person, place, and time.  Skin: Skin is warm and dry.  Psychiatric: She has a normal mood and affect. Her behavior is normal. Thought content normal.   Urinalysis    Component Value Date/Time   COLORURINE STRAW (A) 01/07/2016 1800   APPEARANCEUR CLEAR 01/07/2016 1800   LABSPEC 1.010 01/07/2016 1800   PHURINE 7.0 01/07/2016 1800   GLUCOSEU NEGATIVE 01/07/2016 1800   HGBUR TRACE (A) 01/07/2016 1800   BILIRUBINUR NEGATIVE 01/07/2016 1800   KETONESUR NEGATIVE 01/07/2016 1800   PROTEINUR NEGATIVE 01/07/2016 1800   NITRITE NEGATIVE 01/07/2016 1800   LEUKOCYTESUR NEGATIVE 01/07/2016 1800   Micro: 0-5SE MAU Course  Procedures  MDM Given Motrin 800mg - feels better UA ordered  Assessment and Plan  IUP@20 .1 Uterine leiomyomas causing abd pain- ?degenerating  Given rx for short course NSAID- Motrin [id number] 5d Continue Flagyl course for BV Keep [facility] appt 9/18 @ [facility]  Serita Grammes CNM 01/07/2016, 9:21 PM

## 2016-01-07 NOTE — Discharge Instructions (Signed)
Uterine Fibroids Uterine fibroids are tissue masses (tumors) that can develop in the womb (uterus). They are also called leiomyomas. This type of tumor is not cancerous (benign) and does not spread to other parts of the body outside of the pelvic area, which is between the hip bones. Occasionally, fibroids may develop in the fallopian tubes, in the cervix, or on the support structures (ligaments) that surround the uterus. You can have one or many fibroids. Fibroids can vary in size, weight, and where they grow in the uterus. Some can become quite large. Most fibroids do not require medical treatment. CAUSES A fibroid can develop when a single uterine cell keeps growing (replicating). Most cells in the human body have a control mechanism that keeps them from replicating without control. SIGNS AND SYMPTOMS Symptoms may include:   Heavy bleeding during your period.  Bleeding or spotting between periods.  Pelvic pain and pressure.  Bladder problems, such as needing to urinate more often (urinary frequency) or urgently.  Inability to reproduce offspring (infertility).  Miscarriages. DIAGNOSIS Uterine fibroids are diagnosed through a physical exam. Your health care provider may feel the lumpy tumors during a pelvic exam. Ultrasonography and an MRI may be done to determine the size, location, and number of fibroids. TREATMENT Treatment may include:  Watchful waiting. This involves getting the fibroid checked by your health care provider to see if it grows or shrinks. Follow your health care provider's recommendations for how often to have this checked.  Hormone medicines. These can be taken by mouth or given through an intrauterine device (IUD).  Surgery.  Removing the fibroids (myomectomy) or the uterus (hysterectomy).  Removing blood supply to the fibroids (uterine artery embolization). If fibroids interfere with your fertility and you want to become pregnant, your health care provider  may recommend having the fibroids removed.  HOME CARE INSTRUCTIONS  Keep all follow-up visits as directed by your health care provider. This is important.  Take medicines only as directed by your health care provider.  If you were prescribed a hormone treatment, take the hormone medicines exactly as directed.  Do not take aspirin, because it can cause bleeding.  Ask your health care provider about taking iron pills and increasing the amount of dark green, leafy vegetables in your diet. These actions can help to boost your blood iron levels, which may be affected by heavy menstrual bleeding.  Pay close attention to your period and tell your health care provider about any changes, such as:  Increased blood flow that requires you to use more pads or tampons than usual per month.  A change in the number of days that your period lasts per month.  A change in symptoms that are associated with your period, such as abdominal cramping or back pain. SEEK MEDICAL CARE IF:  You have pelvic pain, back pain, or abdominal cramps that cannot be controlled with medicines.  You have an increase in bleeding between and during periods.  You soak tampons or pads in a half hour or less.  You feel lightheaded, extra tired, or weak. SEEK IMMEDIATE MEDICAL CARE IF:  You faint.  You have a sudden increase in pelvic pain.   This information is not intended to replace advice given to you by your health care provider. Make sure you discuss any questions you have with your health care provider.   Document Released: 04/07/2000 Document Revised: 05/01/2014 Document Reviewed: 10/07/2013 Elsevier Interactive Patient Education 2016 [employer]  

## 2016-01-07 NOTE — MAU Note (Signed)
Pt was seen here yesterday for same problem.  Lower abd pain which started to worsen about 1300.  Started taking antibiotic today at 1600 which was prescribed last night.  Denies vaginal bleeding.  No abnormal vaginal discharge.

## 2016-01-10 ENCOUNTER — Other Ambulatory Visit (HOSPITAL_COMMUNITY): Payer: Self-pay | Admitting: Nurse Practitioner

## 2016-01-10 DIAGNOSIS — O09522 Supervision of elderly multigravida, second trimester: Secondary | ICD-10-CM

## 2016-01-10 DIAGNOSIS — Z3A23 23 weeks gestation of pregnancy: Secondary | ICD-10-CM

## 2016-01-10 DIAGNOSIS — Z3689 Encounter for other specified antenatal screening: Secondary | ICD-10-CM

## 2016-01-24 ENCOUNTER — Ambulatory Visit (HOSPITAL_COMMUNITY)
Admission: RE | Admit: 2016-01-24 | Discharge: 2016-01-24 | Disposition: A | Discharge status: Home / Self Care | Payer: Medicaid Other | Source: Ambulatory Visit | Attending: Nurse Practitioner | Admitting: Nurse Practitioner

## 2016-01-24 ENCOUNTER — Encounter (HOSPITAL_COMMUNITY): Payer: Self-pay

## 2016-01-24 DIAGNOSIS — Z3A23 23 weeks gestation of pregnancy: Secondary | ICD-10-CM

## 2016-01-24 DIAGNOSIS — Z315 Encounter for genetic counseling: Secondary | ICD-10-CM | POA: Diagnosis present

## 2016-01-24 DIAGNOSIS — O09522 Supervision of elderly multigravida, second trimester: Secondary | ICD-10-CM

## 2016-01-24 DIAGNOSIS — Z3689 Encounter for other specified antenatal screening: Secondary | ICD-10-CM

## 2016-01-24 DIAGNOSIS — O09512 Supervision of elderly primigravida, second trimester: Secondary | ICD-10-CM | POA: Diagnosis not present

## 2016-01-24 DIAGNOSIS — O99312 Alcohol use complicating pregnancy, second trimester: Secondary | ICD-10-CM | POA: Insufficient documentation

## 2016-01-24 DIAGNOSIS — Z3A22 22 weeks gestation of pregnancy: Secondary | ICD-10-CM | POA: Diagnosis not present

## 2016-01-24 NOTE — Progress Notes (Signed)
Genetic Counseling  High-Risk Gestation Note  Appointment Date:  01/24/2016 Referred By: Shelbie Ammons, NP Date of Birth:  Jan 09, 1976   Pregnancy History: G1P0 Estimated Date of Delivery: 05/25/16 Estimated Gestational Age: [redacted]w[redacted]d Attending: Renella Cunas, MD   Ms. Fonnie Birkenhead Rodriques was seen for genetic counseling because of a maternal age of 40 y.o..     In summary:  Discussed AMA and associated risk for fetal aneuploidy  Reviewed results of Quad screening within normal range and reduction in risks for fetal Down syndrome, Trisomy 18, and ONTDs  Discussed options for screening  NIPS-elected to pursue today (Panorama)  Ultrasound-performed today  Discussed diagnostic testing options  Amniocentesis-declined  Reviewed family history concerns  Pregnancy exposures  Patient unaware of pregnancy until [redacted] weeks gestation; reported daily alcohol use prior to that time  Reviewed varied associations of alcohol use in pregnancy and limitations with assessing for effects in pregnancy  Detailed ultrasound and fetal echocardiogram available  Discussed carrier screening options  CF-OB records indicated that was drawn and is currently pending  SMA-elected to pursue today  Hemoglobinopathies- being performed through OB office according to [facility] records  She was counseled regarding maternal age and the association with risk for chromosome conditions due to nondisjunction with aging of the ova.   We reviewed chromosomes, nondisjunction, and the associated 1 in 37 risk for fetal aneuploidy related to a maternal age of 40 y.o. at [redacted]w[redacted]d gestation.  She was counseled that the risk for aneuploidy decreases as gestational age increases, accounting for those pregnancies which spontaneously abort.  We specifically discussed Down syndrome (trisomy 35), trisomies 42 and 69, and sex chromosome aneuploidies (47,XXX and 47,XXY) including the common features and prognoses of each.   Ms. Pethtel  previously had Quad screen performed, which was within normal range. We reviewed the reduction in risks for fetal Down syndrome (1 in 162 to 1 in 343), Trisomy 18 (1 in 487 to 1 in 9491) and ONTDs. We reviewed that screening tests are used to modify the a priori risk for these conditions to provide pregnancy specific risk assessment. We reviewed the sensitivity of this screen and discussed that it is not diagnostic. She understands that it does not assess for all chromosome conditions or birth defects.   We reviewed additional available screening options including noninvasive prenatal screening (NIPS)/cell free DNA (cfDNA) screening and detailed ultrasound.  She was counseled that screening tests are used to modify a patient's a priori risk for aneuploidy, typically based on age. This estimate provides a pregnancy specific risk assessment. We reviewed the benefits and limitations of each option. Specifically, we discussed the conditions for which each test screens, the detection rates, and false positive rates of each. She was also counseled regarding diagnostic testing via amniocentesis. We reviewed the approximate 1 in [id number] risk for complications from amniocentesis, including spontaneous pregnancy loss. We discussed the possible results that the tests might provide including: positive, negative, unanticipated, and no result. Finally, they were counseled regarding the cost of each option and potential out of pocket expenses. After consideration of all the options, she elected to proceed with NIPS (Panorama) today.  Those results will be available in 8-10 days.  She declined amniocentesis.   Ms. Edwena Ghan [facility] denied exposure to environmental toxins or chemical agents. She reported drinking alcohol daily, approximately 2-3 mixed liquor drinks, until approximately [redacted] weeks gestation, at which point she discovered the pregnancy. She reported no alcohol use in pregnancy since [redacted] weeks gestation. She also  reported marijuana exposure during the first trimester.  Prenatal alcohol exposure can increase the risk for growth delays, small head size, heart defects, eye and facial differences, as well as behavior problems and learning disabilities. The risk of these to occur tends to increase with the amount of alcohol consumed. However, because there is no identified safe amount of alcohol in pregnancy, it is recommended to completely avoid alcohol in pregnancy. We reviewed the limitations of screening for potential effects of prenatal alcohol use during a pregnancy but discussed that detailed ultrasound and fetal echocardiogram are available to assess fetal physical development. Prior to reviewing the specific exposures, we reviewed the general principles of teratogenic agents. Every pregnancy carries the risk for congenital anomalies of approximately 3-5%. To show that an agent is teratogenic, the rate of abnormalities for exposed pregnancies must be greater than that expected due to the background risk.  The dosage and timing of an exposure are also very important, with the first trimester of pregnancy being the most critical. We reviewed that available data regarding recreational use of marijuana in pregnancy have not indicated an association with increased risk for birth defects. Some studies have indicated a possible association with prenatal marijuana use and decreased fetal growth. Given this association, we discussed that no marijuana use in pregnancy is recommended. Given the reported amount of marijuana exposure, the risk for associated effects in the current pregnancy is likely low. She denied significant viral illnesses during the course of her pregnancy. Her medical and surgical histories were noncontributory.   A complete ultrasound was performed today. The ultrasound report will be sent under separate cover. There were no visualized fetal anomalies or markers suggestive of aneuploidy. Diagnostic testing  was declined today.  She understands that screening tests cannot rule out all birth defects or genetic syndromes. The patient was advised of this limitation and states she still does not want additional testing at this time.   Ms Allean Schwiebert [facility]  was provided with written information regarding cystic fibrosis (CF), spinal muscular atrophy (SMA) and hemoglobinopathies including the carrier frequency, availability of carrier screening and prenatal diagnosis if indicated.  In addition, we discussed that CF and hemoglobinopathies are routinely screened for as part of the [place] newborn screening panel. OB medical records indicate that CF and hemoglobinopathy screening were drawn through [facility] and are currently pending.  After further discussion, she elected to pursue screening for SMA.  Both family histories were reviewed and found to be contributory for mental health conditions. The patient reported a personal history of depression and anxiety. She reported that both of her parents have mental health conditions, and her brother reportedly has bipolar disorder. We discussed that for the majority of cases of mental health conditions, such as depression, an underlying genetic cause is not known but a combination of genetic and environmental factors (multifactorial inheritance) are suspected to contribute to their onset.  Recurrence risk mental health conditions for first degree relatives is increased above the general population risk, in the case of multifactorial inheritance observed. In some families, mental health conditions may even be dominant, meaning that when one parent has the condition each child could have up to a 50% risk to inherit the condition. We discussed that it might be helpful for pediatricians to be aware of this family history to ensure that family members are followed appropriately. The patient understands that prenatal testing or screening is not available for the  majority of mental health conditions.  The patient reported that her sister's daughter had cerebral palsy. She was born premature and was unable to walk or talk. She died at age 40 years old.  Cerebral palsy (CP) is a group of clinical syndromes that range in severity, characterized by abnormal muscle tone, posture, and movement. Cerebral palsy is due to abnormalities in the developing brain resulting from a variety of causes. The etiology is reported to be multifactorial, with most cases typically due to prenatal factors. Prematurity is the most common association, but in many cases, no cause is identified. A specific genetic cause for cerebral palsy has not been identified, and underlying genetic disorders are relatively uncommon in individuals with CP.  Given the reported family history, recurrence risk for the current pregnancy would likely be low. Additional information regarding this individual's underlying condition or etiology may alter recurrence risk assessment. Without further information regarding the provided family history, an accurate genetic risk cannot be calculated. Further genetic counseling is warranted if more information is obtained.  The patient was not familiar with the father of the baby's family history.  We, therefore, cannot comment on how his history might contribute to the overall chance for the baby to have a birth defect.  Consanguinity was denied. Without further information regarding the provided family history, an accurate genetic risk cannot be calculated. Further genetic counseling is warranted if more information is obtained.  I counseled Ms. Addison regarding the above risks and available options.  The approximate face-to-face time with the genetic counselor was 40 minutes.  Chipper Oman, MS,  Certified Genetic Counselor 01/24/2016

## 2016-01-25 ENCOUNTER — Other Ambulatory Visit (HOSPITAL_COMMUNITY): Payer: Self-pay | Admitting: *Deleted

## 2016-01-25 DIAGNOSIS — O09519 Supervision of elderly primigravida, unspecified trimester: Secondary | ICD-10-CM

## 2016-01-27 ENCOUNTER — Other Ambulatory Visit (HOSPITAL_COMMUNITY): Payer: Self-pay

## 2016-01-31 ENCOUNTER — Telehealth (HOSPITAL_COMMUNITY): Payer: Self-pay | Admitting: MS"

## 2016-01-31 ENCOUNTER — Other Ambulatory Visit (HOSPITAL_COMMUNITY): Payer: Self-pay

## 2016-01-31 NOTE — Telephone Encounter (Signed)
Attempted to contact patient regarding results of noninvasive prenatal screening (NIPS)/Panorama, which are within normal range. Patient's voice mailbox not set up. Unable to leave message.   Santiago Glad Stan Cantave 01/31/2016 11:30 AM

## 2016-02-01 ENCOUNTER — Telehealth (HOSPITAL_COMMUNITY): Payer: Self-pay

## 2016-02-01 NOTE — Telephone Encounter (Signed)
Called Erin Gibbs to discuss her prenatal cell free DNA test results.  Ms. Sehar Biswas [facility] had Panorama testing through [facility].  Testing was offered because of a maternal age of 75.  The patient was identified by name and DOB.  We reviewed that these are within normal limits, showing a less than 1 in 10,000 risk for trisomies 21, 18 and 13, and monosomy X (Turner syndrome).  In addition, the risk for triploidy/vanishing twin and sex chromosome trisomies (47,XXX and 47,XXY) was also low risk.  We reviewed that this testing identifies > 99% of pregnancies with trisomy 77, trisomy 2, sex chromosome trisomies (47,XXX and 47,XXY), and triploidy. The detection rate for trisomy 18 is 96%.  The detection rate for monosomy X is ~92%.  The false positive rate is <0.1% for all conditions. Testing was also consistent with female fetal sex.  The patient did wish to know fetal sex.  She understands that this testing does not identify all genetic conditions.  All questions were answered to her satisfaction, she was encouraged to call with additional questions or concerns.  Sharyne Richters, MS Certified Genetic Counselor

## 2016-02-07 ENCOUNTER — Other Ambulatory Visit (HOSPITAL_COMMUNITY): Payer: Self-pay

## 2016-03-06 ENCOUNTER — Other Ambulatory Visit (HOSPITAL_COMMUNITY): Payer: Self-pay | Admitting: Maternal and Fetal Medicine

## 2016-03-06 ENCOUNTER — Ambulatory Visit (HOSPITAL_COMMUNITY): Payer: Medicaid Other

## 2016-03-06 ENCOUNTER — Ambulatory Visit (HOSPITAL_COMMUNITY)
Admission: RE | Admit: 2016-03-06 | Discharge: 2016-03-06 | Disposition: A | Discharge status: Home / Self Care | Payer: Medicaid Other | Source: Ambulatory Visit | Attending: Nurse Practitioner | Admitting: Nurse Practitioner

## 2016-03-06 DIAGNOSIS — O3413 Maternal care for benign tumor of corpus uteri, third trimester: Secondary | ICD-10-CM

## 2016-03-06 DIAGNOSIS — O09519 Supervision of elderly primigravida, unspecified trimester: Secondary | ICD-10-CM

## 2016-03-06 DIAGNOSIS — D259 Leiomyoma of uterus, unspecified: Secondary | ICD-10-CM

## 2016-03-06 DIAGNOSIS — O99213 Obesity complicating pregnancy, third trimester: Secondary | ICD-10-CM | POA: Insufficient documentation

## 2016-03-06 DIAGNOSIS — O358XX0 Maternal care for other (suspected) fetal abnormality and damage, not applicable or unspecified: Secondary | ICD-10-CM

## 2016-03-06 DIAGNOSIS — O09513 Supervision of elderly primigravida, third trimester: Secondary | ICD-10-CM | POA: Insufficient documentation

## 2016-03-06 DIAGNOSIS — O99313 Alcohol use complicating pregnancy, third trimester: Secondary | ICD-10-CM

## 2016-03-06 DIAGNOSIS — O403XX0 Polyhydramnios, third trimester, not applicable or unspecified: Secondary | ICD-10-CM

## 2016-03-06 DIAGNOSIS — Z3A28 28 weeks gestation of pregnancy: Secondary | ICD-10-CM | POA: Diagnosis not present

## 2016-03-07 ENCOUNTER — Ambulatory Visit (HOSPITAL_COMMUNITY): Payer: Medicaid Other

## 2016-03-07 ENCOUNTER — Other Ambulatory Visit (HOSPITAL_COMMUNITY): Payer: Self-pay | Admitting: *Deleted

## 2016-03-07 DIAGNOSIS — O09523 Supervision of elderly multigravida, third trimester: Secondary | ICD-10-CM

## 2016-03-14 ENCOUNTER — Ambulatory Visit (HOSPITAL_COMMUNITY)
Admission: RE | Admit: 2016-03-14 | Discharge: 2016-03-14 | Disposition: A | Discharge status: Home / Self Care | Payer: Medicaid Other | Source: Ambulatory Visit | Attending: Nurse Practitioner | Admitting: Nurse Practitioner

## 2016-03-14 ENCOUNTER — Encounter (HOSPITAL_COMMUNITY): Payer: Self-pay

## 2016-03-14 ENCOUNTER — Other Ambulatory Visit (HOSPITAL_COMMUNITY): Payer: Self-pay | Admitting: Obstetrics and Gynecology

## 2016-03-14 DIAGNOSIS — Z3A29 29 weeks gestation of pregnancy: Secondary | ICD-10-CM

## 2016-03-14 DIAGNOSIS — O3413 Maternal care for benign tumor of corpus uteri, third trimester: Secondary | ICD-10-CM

## 2016-03-14 DIAGNOSIS — O09513 Supervision of elderly primigravida, third trimester: Secondary | ICD-10-CM | POA: Insufficient documentation

## 2016-03-14 DIAGNOSIS — D259 Leiomyoma of uterus, unspecified: Secondary | ICD-10-CM

## 2016-03-14 DIAGNOSIS — O09523 Supervision of elderly multigravida, third trimester: Secondary | ICD-10-CM

## 2016-03-14 DIAGNOSIS — O99313 Alcohol use complicating pregnancy, third trimester: Secondary | ICD-10-CM

## 2016-03-14 NOTE — Addendum Note (Signed)
Encounter addended by: Eusebio Friendly, RT, RVT, RDMS on: 03/14/2016  5:37 PM<BR>    Actions taken: Imaging Exam ended

## 2016-03-21 ENCOUNTER — Other Ambulatory Visit (HOSPITAL_COMMUNITY): Payer: Self-pay | Admitting: Obstetrics and Gynecology

## 2016-03-21 ENCOUNTER — Encounter (HOSPITAL_COMMUNITY): Payer: Self-pay

## 2016-03-21 ENCOUNTER — Ambulatory Visit (HOSPITAL_COMMUNITY)
Admission: RE | Admit: 2016-03-21 | Discharge: 2016-03-21 | Disposition: A | Discharge status: Home / Self Care | Payer: Medicaid Other | Source: Ambulatory Visit | Attending: Nurse Practitioner | Admitting: Nurse Practitioner

## 2016-03-21 DIAGNOSIS — O09512 Supervision of elderly primigravida, second trimester: Secondary | ICD-10-CM | POA: Insufficient documentation

## 2016-03-21 DIAGNOSIS — O403XX1 Polyhydramnios, third trimester, fetus 1: Secondary | ICD-10-CM | POA: Insufficient documentation

## 2016-03-21 DIAGNOSIS — O99313 Alcohol use complicating pregnancy, third trimester: Secondary | ICD-10-CM | POA: Insufficient documentation

## 2016-03-21 DIAGNOSIS — O3410 Maternal care for benign tumor of corpus uteri, unspecified trimester: Secondary | ICD-10-CM | POA: Insufficient documentation

## 2016-03-21 DIAGNOSIS — O09523 Supervision of elderly multigravida, third trimester: Secondary | ICD-10-CM

## 2016-03-21 DIAGNOSIS — Z3A30 30 weeks gestation of pregnancy: Secondary | ICD-10-CM

## 2016-03-28 ENCOUNTER — Ambulatory Visit (HOSPITAL_COMMUNITY): Payer: Medicaid Other

## 2016-03-30 ENCOUNTER — Other Ambulatory Visit (HOSPITAL_COMMUNITY): Payer: Self-pay | Admitting: Obstetrics and Gynecology

## 2016-03-30 ENCOUNTER — Ambulatory Visit (HOSPITAL_COMMUNITY)
Admission: RE | Admit: 2016-03-30 | Discharge: 2016-03-30 | Disposition: A | Discharge status: Home / Self Care | Payer: Medicaid Other | Source: Ambulatory Visit | Attending: Nurse Practitioner | Admitting: Nurse Practitioner

## 2016-03-30 ENCOUNTER — Encounter (HOSPITAL_COMMUNITY): Payer: Self-pay

## 2016-03-30 DIAGNOSIS — O358XX0 Maternal care for other (suspected) fetal abnormality and damage, not applicable or unspecified: Secondary | ICD-10-CM

## 2016-03-30 DIAGNOSIS — O99313 Alcohol use complicating pregnancy, third trimester: Secondary | ICD-10-CM | POA: Diagnosis present

## 2016-03-30 DIAGNOSIS — O403XX0 Polyhydramnios, third trimester, not applicable or unspecified: Secondary | ICD-10-CM

## 2016-03-30 DIAGNOSIS — O3413 Maternal care for benign tumor of corpus uteri, third trimester: Secondary | ICD-10-CM | POA: Diagnosis not present

## 2016-03-30 DIAGNOSIS — Z3A32 32 weeks gestation of pregnancy: Secondary | ICD-10-CM

## 2016-03-30 DIAGNOSIS — O09523 Supervision of elderly multigravida, third trimester: Secondary | ICD-10-CM

## 2016-03-30 DIAGNOSIS — O09513 Supervision of elderly primigravida, third trimester: Secondary | ICD-10-CM | POA: Insufficient documentation

## 2016-04-04 ENCOUNTER — Encounter (HOSPITAL_COMMUNITY): Payer: Self-pay

## 2016-04-04 ENCOUNTER — Ambulatory Visit (HOSPITAL_COMMUNITY): Payer: Medicaid Other

## 2016-11-10 ENCOUNTER — Encounter (HOSPITAL_COMMUNITY): Payer: Self-pay

## 2018-01-23 IMAGING — US US MFM OB LIMITED
1 series · 15 of 28 positions shown · non-contrast
Comparison: none

[Series 1: us mfm ob limited · 15 of 32 slices shown]
[im 1/32]
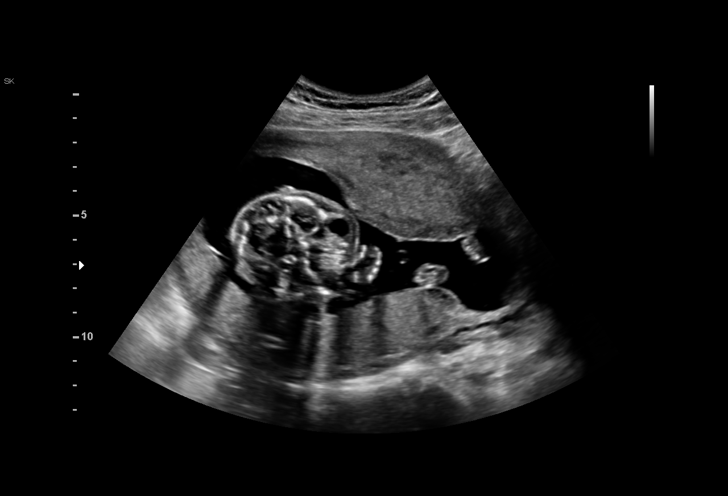
[im 3/32]
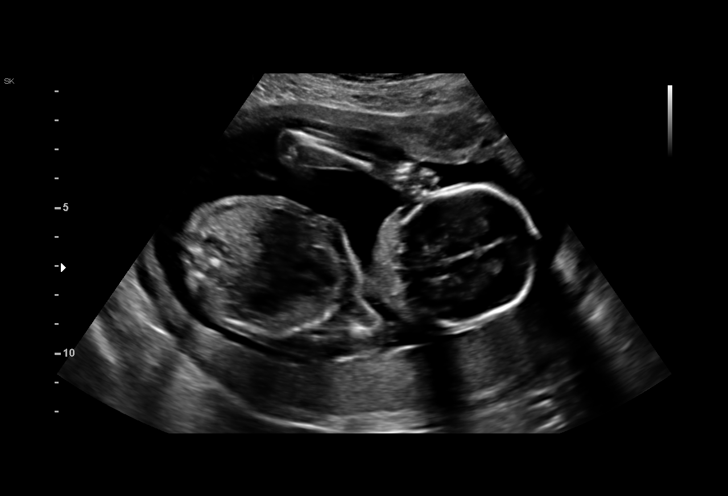
[im 5/32]
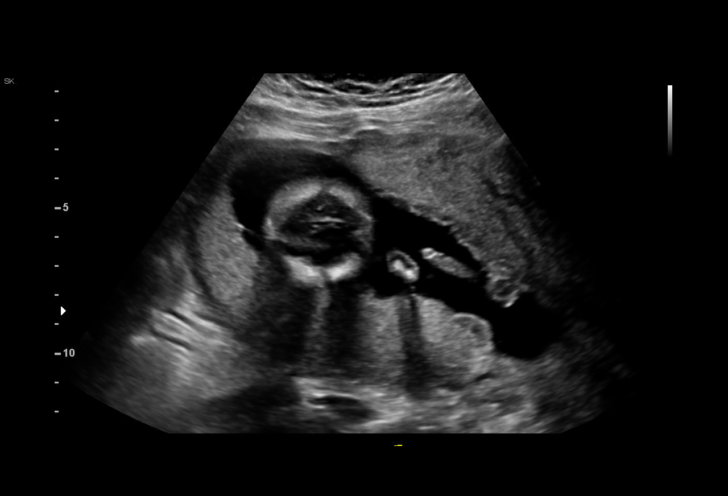
[im 7/32]
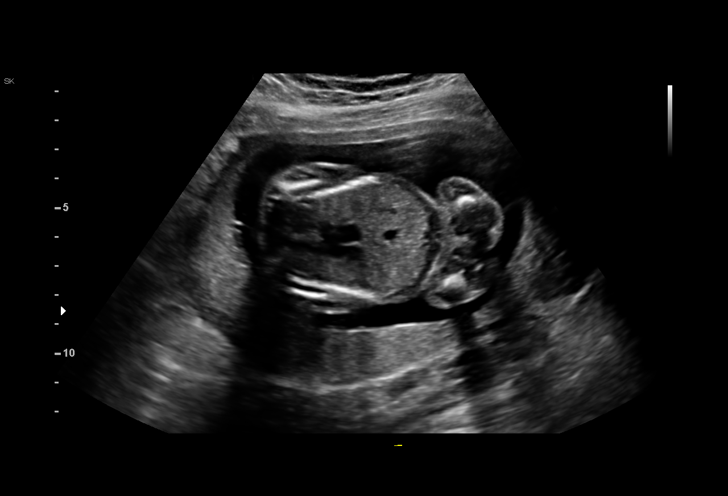
[im 10/32]
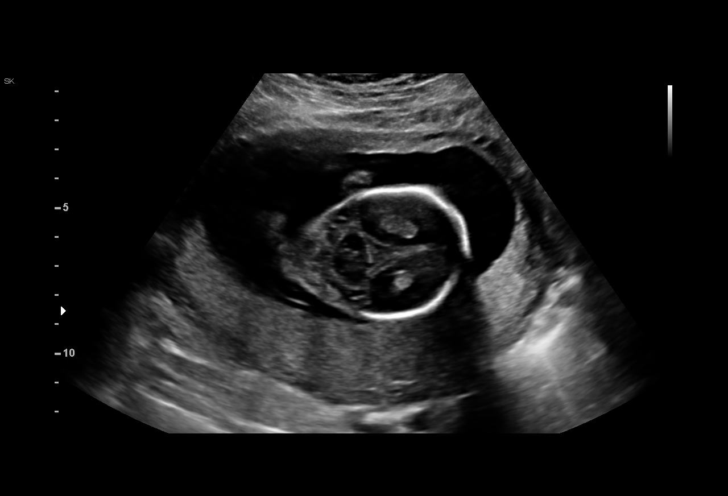
[im 12/32]
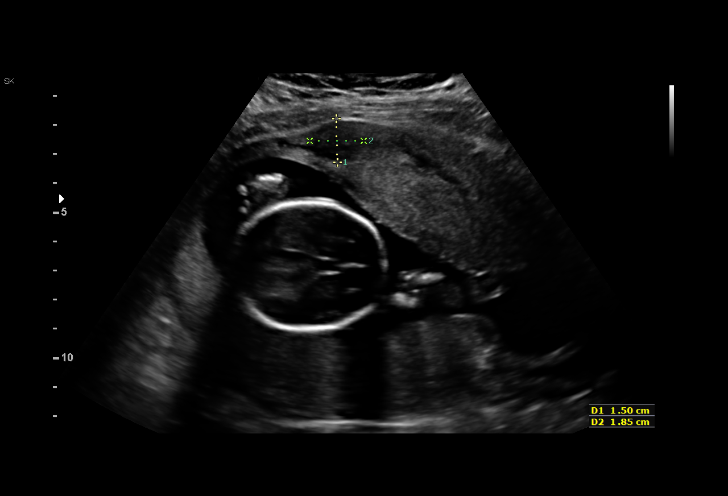
[im 14/32]
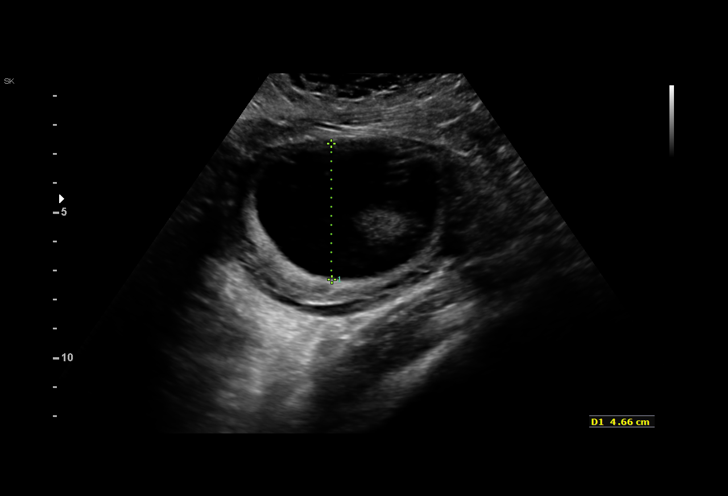
[im 17/32]
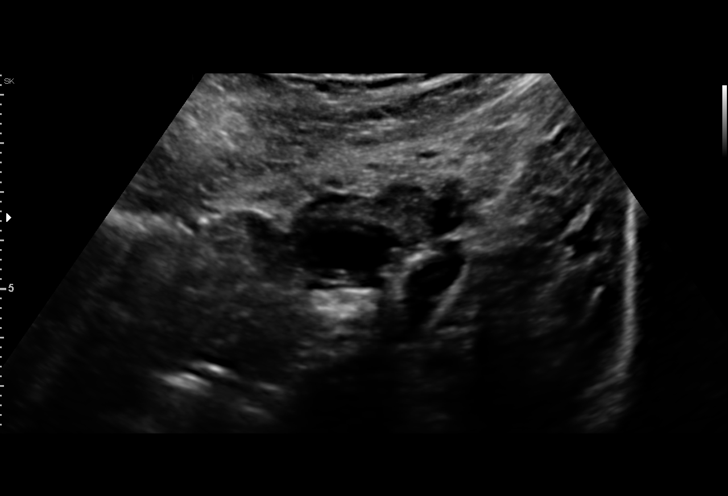
[im 18/32]
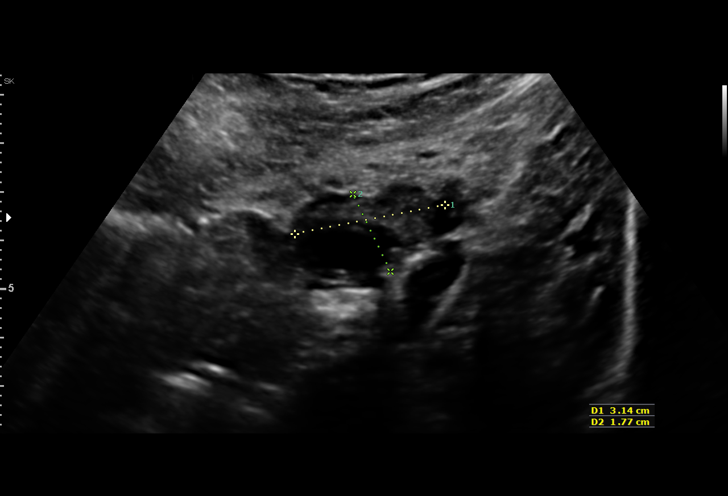
[im 20/32]
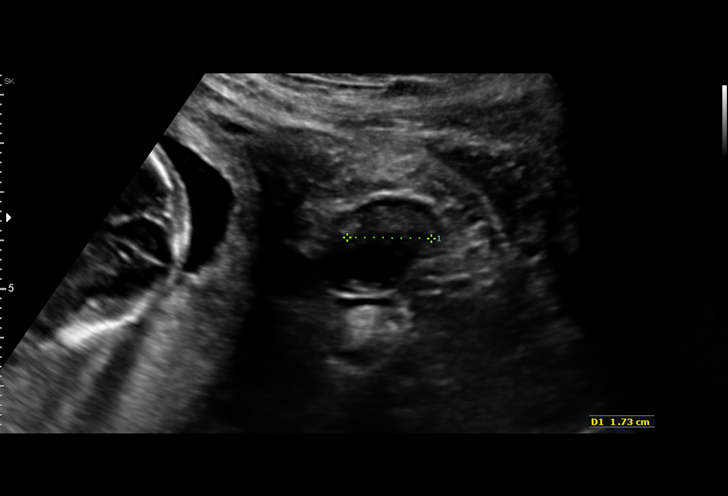
[im 22/32]
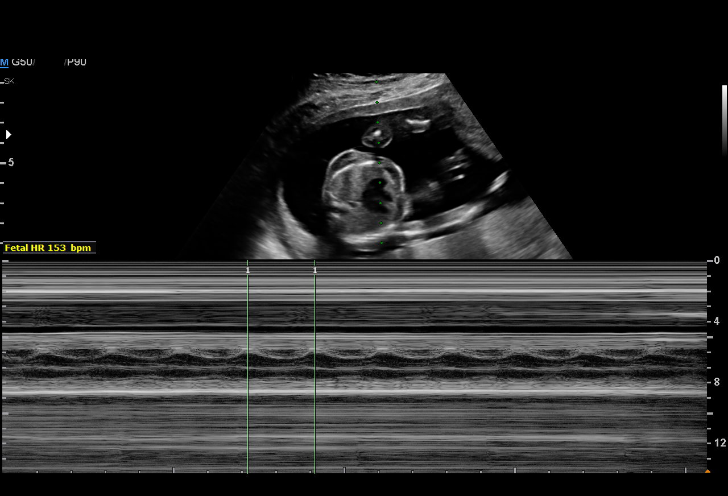
[im 25/32]
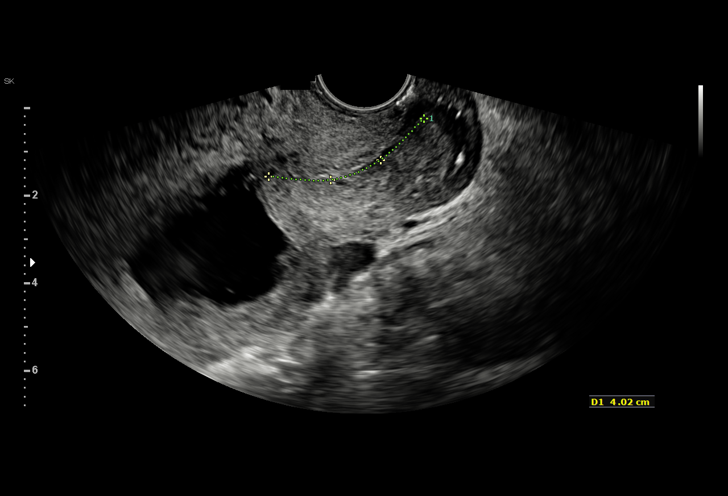
[im 27/32]
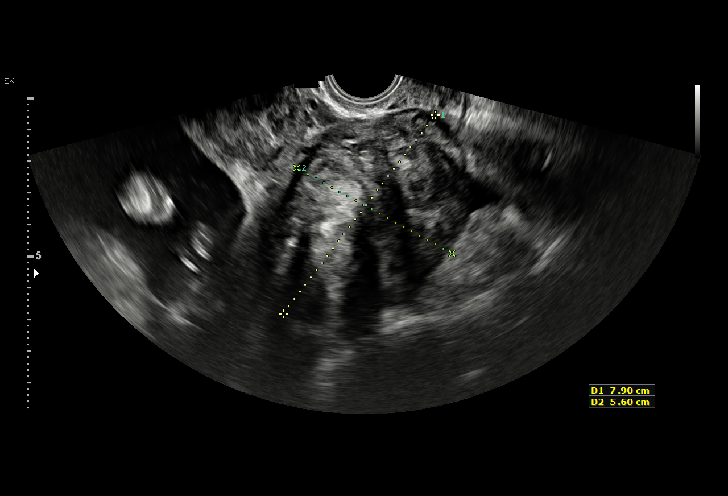
[im 29/32]
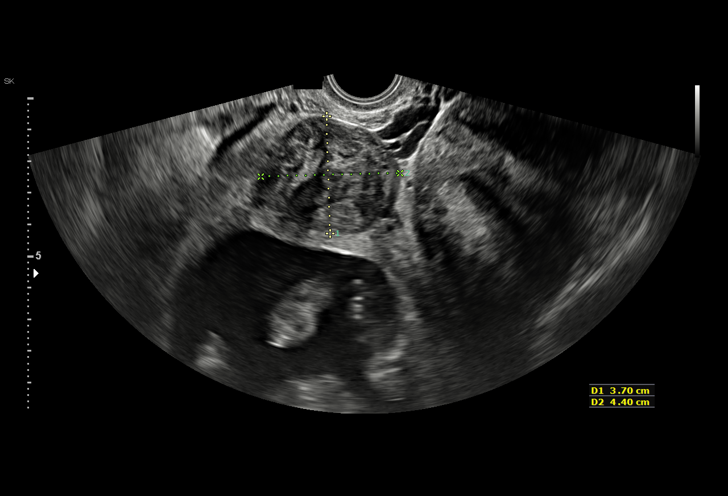
[im 32/32]
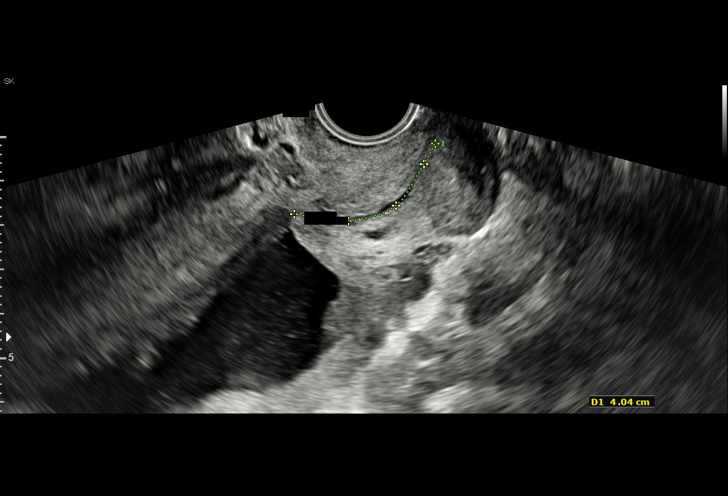

[15 of 28 positions shown; findings below may reference images not displayed]

[place]

08

1  MEZZI TEN              [id number]      [id number]     [id number]
2  MEZZI TEN              [id number]      [id number]     [id number]
Indications

20 weeks gestation of pregnancy
Cervical shortening complicating pregnancy
Uterine fibroids
Advanced maternal age primigravida 35+,
second trimester
OB History

Gravidity:    1
Fetal Evaluation

Num Of Fetuses:     1
Fetal Heart         153
Rate(bpm):
Cardiac Activity:   Observed
Presentation:       Transverse, head to maternal left
Placenta:           Posterior, above cervical os

Amniotic Fluid
AFI FV:      Subjectively within normal limits

Largest Pocket(cm)
4.66
Gestational Age
Clinical EDD:  20w 0d                                        EDD:   05/25/16
Best:          20w 0d    Det. By:   Clinical EDD             EDD:   05/25/16
Cervix Uterus Adnexa

Cervix
Length:              4  cm.
Normal appearance by transvaginal scan

Left Ovary
Small corpus luteum noted.

Right Ovary
Not visualized.
Myomas

Site                     L(cm)      W(cm)      D(cm)      Location
Anterior                 1.9        1.5        2.1        Subserosal
LUS RT                   7.9        5.6        6.3        Subserosal
LUS RT                   4.4        3.7        5.1        Intramural

Blood Flow                 RI        PI       Comments

Impression

Single IUP at 20w 0d
Limited ultrasound performed
Multiple uterine myomas noted as described above
Normal amniotic fluid volume

TVUS - cervical length 4 cm without funneling or dynamic
changes
Recommendations

Follow-up ultrasounds as clinically indicated.

## 2018-04-01 IMAGING — US US MFM FETAL BPP W/O NON-STRESS
2 series · 12 of 28 positions shown · non-contrast
Comparison: none

[Series 1: us mfm fetal bpp w/o non-stress · 25 acquisitions, 11 frames shown (1 of 2)]
[im 2/25]
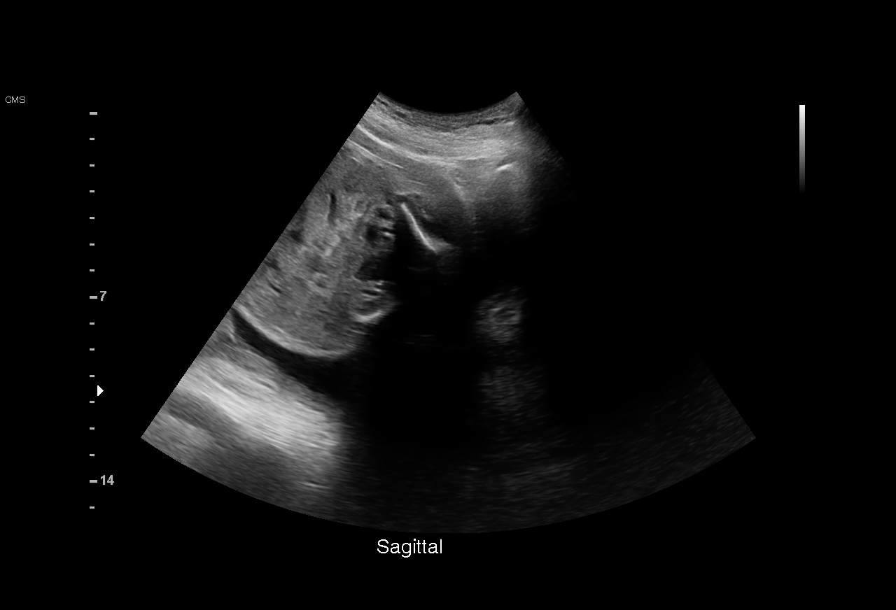
[im 4/25]
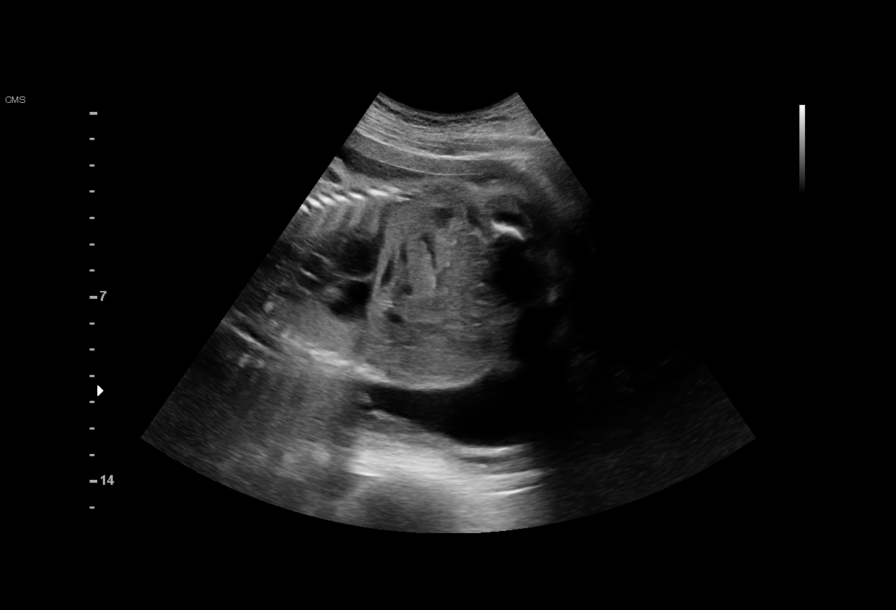
[im 6/25]
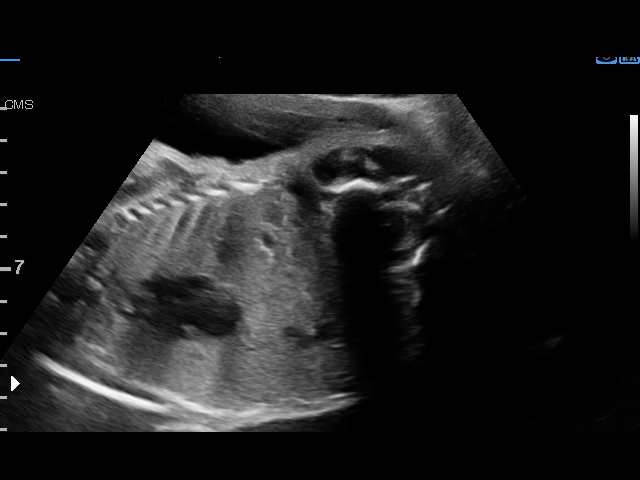
[im 9/25]
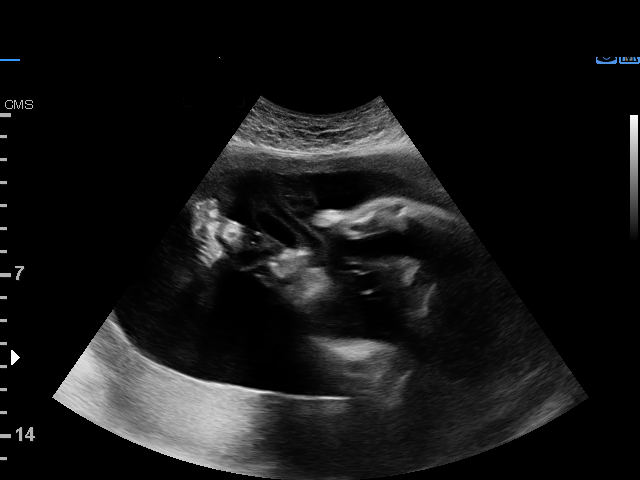
[im 11/25]
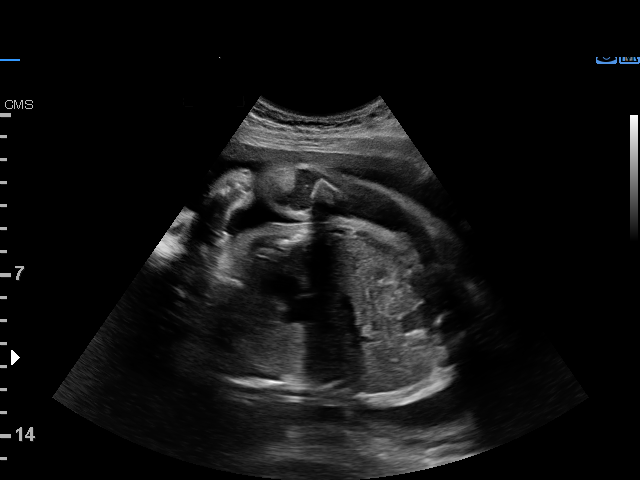
[im 13/25]
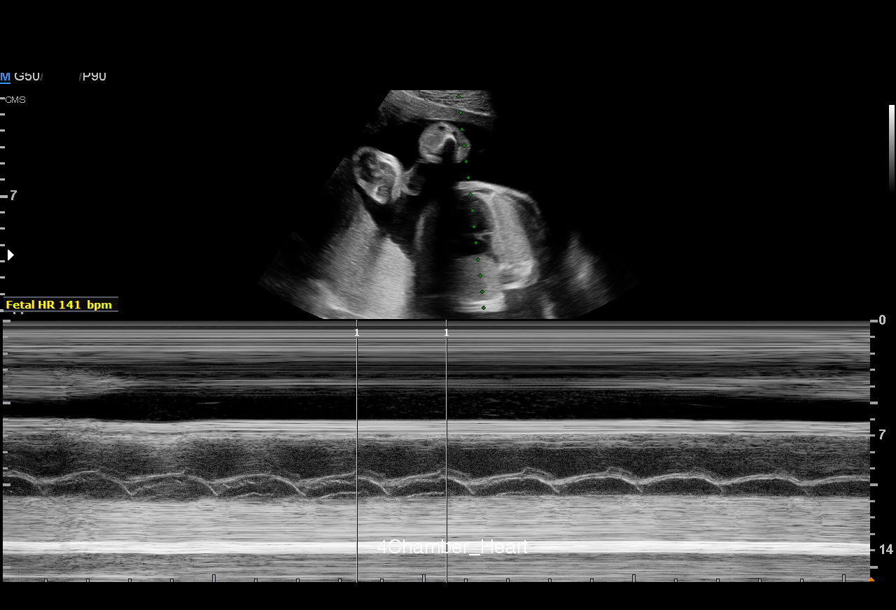
[im 16/25]
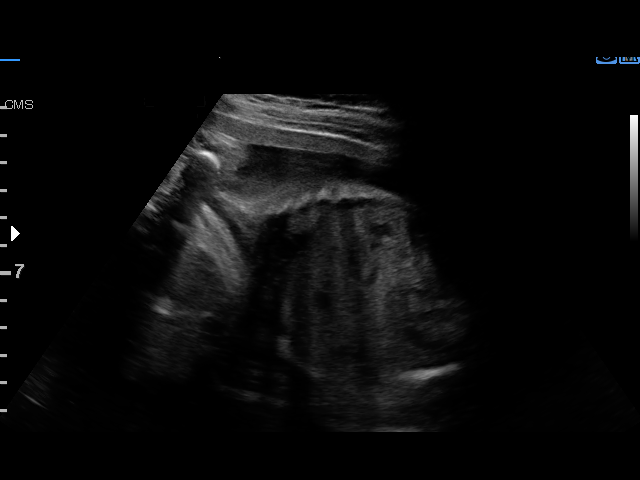
[im 18/25]
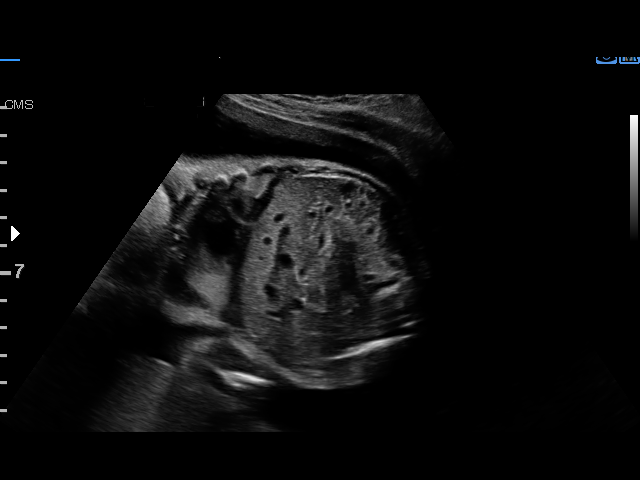
[im 20/25]
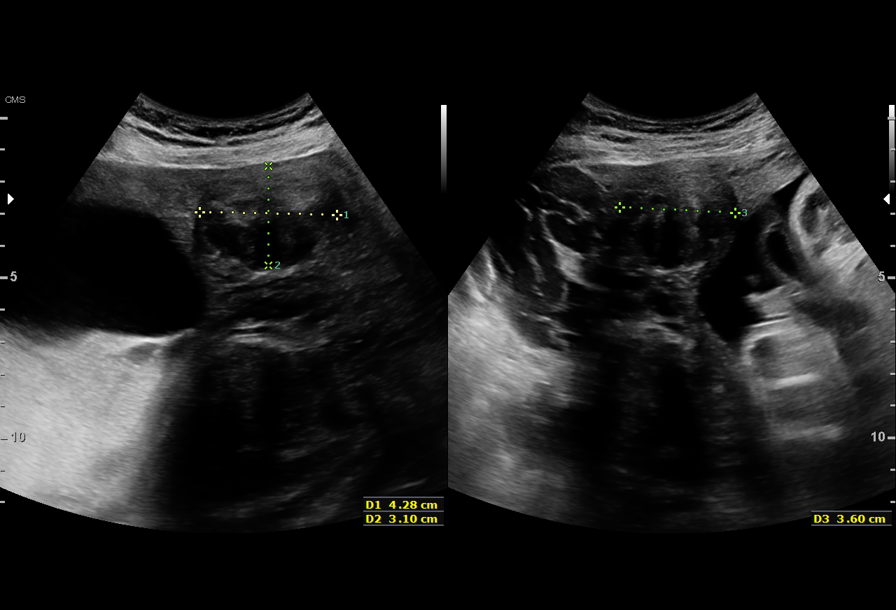
[im 23/25]
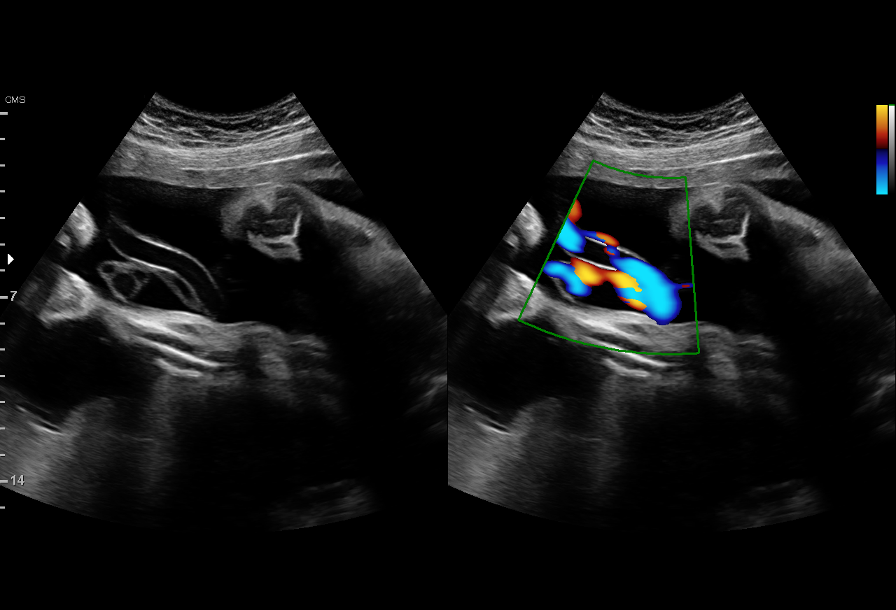
[im 25/25]
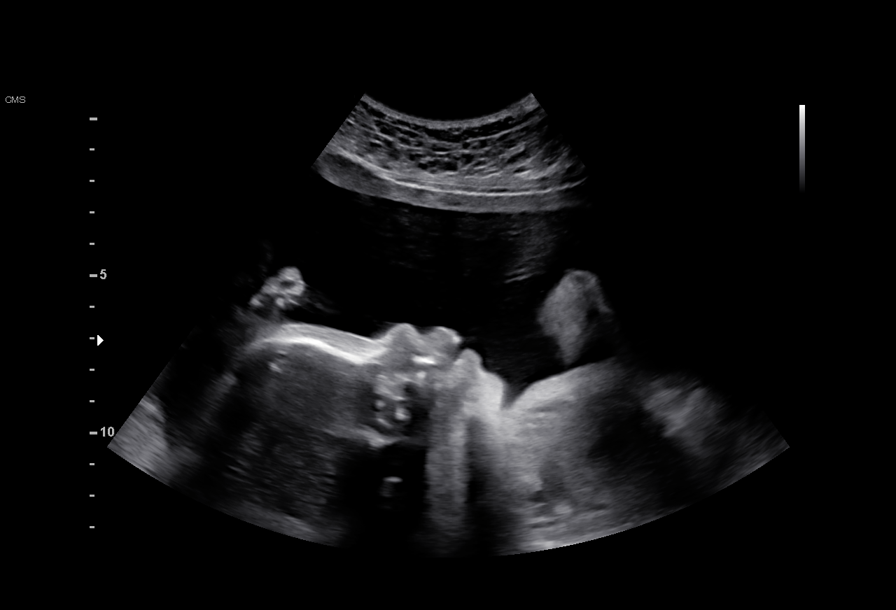

[Series 3: us mfm fetal bpp w/o non-stress · 3 acquisitions, 1 frame shown (2 of 2)]
[im 2/3]
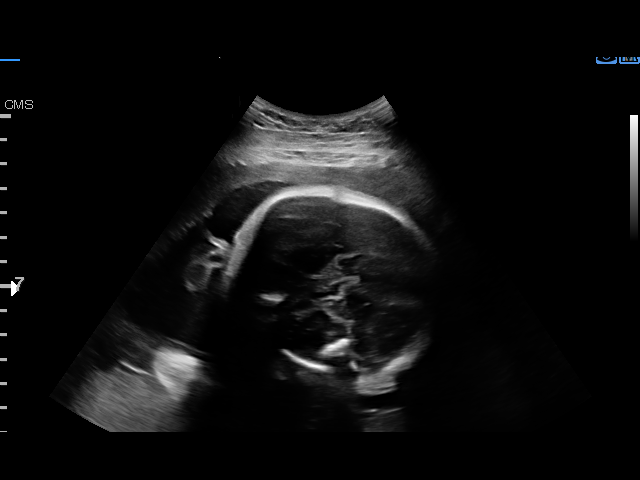

[12 of 28 positions shown; findings below may reference images not displayed]

[place]

Health
[place]

1  DICKEN WALLEN              [id number]      [id number]     [id number]
Indications

29 weeks gestation of pregnancy
Uterine fibroids
Advanced maternal age primigravida 35+,
second trimester
Alcohol use complicating pregnancy, third
trimester
Umbilical vein abnormality complicating
pregnancy
OB History

Gravidity:    1
Fetal Evaluation

Num Of Fetuses:     1
Fetal Heart         141
Rate(bpm):
Cardiac Activity:   Observed
Presentation:       Breech
Placenta:           Posterior, above cervical os

Amniotic Fluid
AFI FV:      Subjectively within normal limits

AFI Sum(cm)     %Tile       Largest Pocket(cm)
23.64           95

RUQ(cm)       RLQ(cm)       LUQ(cm)        LLQ(cm)
5.9
Biophysical Evaluation
Amniotic F.V:   Pocket => 2 cm two         F. Tone:        Observed
planes
F. Movement:    Observed                   Score:          [DATE]
F. Breathing:   Observed
Gestational Age

Best:          29w 5d    Det. By:   Early Ultrasound         EDD:   05/25/16
(12/06/15)
Anatomy

Cranium:               Previously seen        Aortic Arch:            Previously seen
Cavum:                 Previously seen        Ductal Arch:            Previously seen
Ventricles:            Previously seen        Diaphragm:              Previously seen
Choroid Plexus:        Previously seen        Stomach:                Appears normal, left
sided
Cerebellum:            Previously seen        Abdomen:                Umbilical vein
varix noted
Posterior Fossa:       Previously seen        Abdominal Wall:         Previously seen
Nuchal Fold:           Not applicable (>20    Cord Vessels:           Previously seen
wks GA)
Face:                  Orbits and profile     Kidneys:                Previously seen
previously seen
Lips:                  Previously seen        Bladder:                Previously seen
Thoracic:              Previously seen        Spine:                  Previously seen
Heart:                 Appears normal         Upper Extremities:      Previously seen
(4CH, axis, and situs
RVOT:                  Previously seen        Lower Extremities:      Previously seen
LVOT:                  Previously seen

Other:  Fetus appears to be a male. Heels and 5th digit previously visualized.
Cervix Uterus Adnexa

Cervix
Not visualized (advanced GA >98wks)

Uterus
Multiple fibroids noted, see table below.

Left Ovary
Not visualized.

Right Ovary
Not visualized.

Cul De Sac:   No free fluid seen.

Adnexa:       Not Visualized
Myomas

Site                     L(cm)      W(cm)      D(cm)      Location
Anterior
NIELSEN
Posterior
Blood Flow                 RI        PI       Comments

Impression

IUP at 29+5 weeks with umbilical varix, ETOH use in
pregnancy,AMA and uterine fiboids
Normal fetal movement and cardiac activity
AFI normal at 23 cm
BPP [DATE]
Varix has no evidence of turbulent flow
Recommendations

Continue week BPP in the [HOSPITAL]

## 2018-10-10 DIAGNOSIS — Y92410 Unspecified street and highway as the place of occurrence of the external cause: Secondary | ICD-10-CM | POA: Insufficient documentation

## 2018-10-10 DIAGNOSIS — S4992XA Unspecified injury of left shoulder and upper arm, initial encounter: Secondary | ICD-10-CM | POA: Diagnosis present

## 2018-10-10 DIAGNOSIS — Y998 Other external cause status: Secondary | ICD-10-CM | POA: Diagnosis not present

## 2018-10-10 DIAGNOSIS — F1210 Cannabis abuse, uncomplicated: Secondary | ICD-10-CM | POA: Insufficient documentation

## 2018-10-10 DIAGNOSIS — M542 Cervicalgia: Secondary | ICD-10-CM | POA: Insufficient documentation

## 2018-10-10 DIAGNOSIS — R42 Dizziness and giddiness: Secondary | ICD-10-CM | POA: Insufficient documentation

## 2018-10-10 DIAGNOSIS — Y9389 Activity, other specified: Secondary | ICD-10-CM | POA: Diagnosis not present

## 2018-10-10 DIAGNOSIS — S46812A Strain of other muscles, fascia and tendons at shoulder and upper arm level, left arm, initial encounter: Secondary | ICD-10-CM | POA: Diagnosis not present

## 2018-10-11 ENCOUNTER — Other Ambulatory Visit: Payer: Self-pay

## 2018-10-11 ENCOUNTER — Encounter (HOSPITAL_COMMUNITY): Payer: Self-pay | Admitting: Emergency Medicine

## 2018-10-11 ENCOUNTER — Emergency Department (HOSPITAL_COMMUNITY)
Admission: EM | Admit: 2018-10-11 | Discharge: 2018-10-11 | Disposition: A | Discharge status: Home / Self Care | Payer: Medicaid Other | Attending: Emergency Medicine | Admitting: Emergency Medicine

## 2018-10-11 DIAGNOSIS — S46812A Strain of other muscles, fascia and tendons at shoulder and upper arm level, left arm, initial encounter: Secondary | ICD-10-CM

## 2018-10-11 MED ORDER — IBUPROFEN 800 MG PO TABS
800.0000 mg | ORAL_TABLET | Freq: Once | ORAL | Status: AC
Start: 2018-10-11 — End: 2018-10-11
  Administered 2018-10-11: 800 mg via ORAL
  Filled 2018-10-11: qty 1

## 2018-10-11 MED ORDER — ACETAMINOPHEN 500 MG PO TABS
1000.0000 mg | ORAL_TABLET | Freq: Once | ORAL | Status: AC
  Administered 2018-10-11: 1000 mg via ORAL
  Filled 2018-10-11: qty 2
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
## 2018-10-11 NOTE — Discharge Instructions (Signed)
Take 4 over the counter ibuprofen tablets 3 times a day or 2 over-the-counter naproxen tablets twice a day for pain. Also take tylenol 1000mg(2 extra strength) four times a day.    
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
## 2022-11-10 ENCOUNTER — Emergency Department (HOSPITAL_COMMUNITY): Payer: MEDICAID

## 2022-11-10 ENCOUNTER — Other Ambulatory Visit: Payer: Self-pay

## 2022-11-10 ENCOUNTER — Emergency Department (HOSPITAL_COMMUNITY)
Admission: EM | Admit: 2022-11-10 | Discharge: 2022-11-10 | Disposition: A | Discharge status: Home / Self Care | Payer: MEDICAID | Attending: Emergency Medicine | Admitting: Emergency Medicine

## 2022-11-10 DIAGNOSIS — W108XXA Fall (on) (from) other stairs and steps, initial encounter: Secondary | ICD-10-CM | POA: Insufficient documentation

## 2022-11-10 DIAGNOSIS — S8391XA Sprain of unspecified site of right knee, initial encounter: Secondary | ICD-10-CM | POA: Diagnosis not present

## 2022-11-10 DIAGNOSIS — W19XXXA Unspecified fall, initial encounter: Secondary | ICD-10-CM

## 2022-11-10 DIAGNOSIS — S8991XA Unspecified injury of right lower leg, initial encounter: Secondary | ICD-10-CM | POA: Diagnosis present

## 2022-11-10 MED ORDER — OXYCODONE-ACETAMINOPHEN 5-325 MG PO TABS
1.0000 | ORAL_TABLET | Freq: Once | ORAL | Status: AC
  Administered 2022-11-10: 1 via ORAL
  Filled 2022-11-10: qty 1

## 2022-11-10 MED ORDER — OXYCODONE HCL 5 MG PO TABS: 5.0000 mg | ORAL_TABLET | Freq: Four times a day (QID) | ORAL | 0 refills | Status: AC | PRN

## 2022-11-10 MED ORDER — IBUPROFEN 600 MG PO TABS: 600.0000 mg | ORAL_TABLET | Freq: Four times a day (QID) | ORAL | 0 refills | Status: AC | PRN

## 2022-11-10 MED ORDER — OXYCODONE-ACETAMINOPHEN 5-325 MG PO TABS
2.0000 | ORAL_TABLET | Freq: Once | ORAL | Status: AC
  Administered 2022-11-10: 1 via ORAL
  Filled 2022-11-10: qty 2

## 2022-11-10 MED ORDER — IBUPROFEN 200 MG PO TABS
600.0000 mg | ORAL_TABLET | Freq: Once | ORAL | Status: AC
  Administered 2022-11-10: 600 mg via ORAL
  Filled 2022-11-10: qty 3

## 2022-11-10 NOTE — Discharge Instructions (Addendum)
Thank you for letting us take care of you today.  Your x-rays were negative. With your pain and swelling, we will send you home with medications to help with pain. Use NSAIDs/Tylenol as first line for pain. You may take ibuprofen as prescribed in addition to Tylenol which can be purchased over the counter. You can take up to 1000mg  Tylenol every 6 hours as needed. If you are not getting relief with these medications, use the oxycodone as needed for continued, severe pain.

## 2022-11-10 NOTE — ED Triage Notes (Signed)
Pt reports that she was walking down stairs approx 2 hours ago and her right knee gave out causing her to fall and land on same knee.

## 2022-11-10 NOTE — ED Provider Notes (Signed)
[facility] EMERGENCY DEPARTMENT AT [facility] Provider Note   CSN: [id number] Arrival date & time: 11/10/22  1934     History  Chief Complaint  Patient presents with   Knee Pain    Erin Gibbs is a 47 y.o. female who presents to ED c/o right knee pain. Pt reports she was walking down stairs when she accidentally tripped twisting left outward with subsequent pain in knee and difficulty ambulating since. Previous ACL tear with repair in 2012. Notes she fell down approximately 5 steps. She is able to bear weight but it is significantly painful to do so. Denies hitting her head or other injury. No medications taken prior to arrival.       Home Medications Prior to Admission medications   Medication Sig Start Date End Date Taking? Authorizing Provider  ibuprofen (ADVIL) 600 MG tablet Take 1 tablet (600 mg total) by mouth every 6 (six) hours as needed for up to 3 days for mild pain or moderate pain. 11/10/22 11/13/22 Yes Haidy Kackley L, PA-C  oxyCODONE (ROXICODONE) 5 MG immediate release tablet Take 1 tablet (5 mg total) by mouth every 6 (six) hours as needed for up to 3 days for severe pain or breakthrough pain. 11/10/22 11/13/22 Yes Vincen Bejar L, PA-C      Allergies    Patient has no known allergies.    Review of Systems   Review of Systems  All other systems reviewed and are negative.   Physical Exam Updated Vital Signs BP (!) 136/96 (BP Location: Right Arm) Comment: Simultaneous filing. User may not have seen previous data.  Pulse 68 Comment: Simultaneous filing. User may not have seen previous data.  Temp 98.2 F (36.8 C) (Oral) Comment: Simultaneous filing. User may not have seen previous data. Comment (Src): Simultaneous filing. User may not have seen previous data.  Resp 20 Comment: Simultaneous filing. User may not have seen previous data.  Ht 5\' 6"  (1.676 m)   Wt 74.8 kg   LMP 10/27/2022 (Exact Date)   SpO2 100% Comment: Simultaneous filing.  User may not have seen previous data.  BMI 26.63 kg/m  Physical Exam Vitals and nursing note reviewed.  Constitutional:      General: She is not in acute distress.    Appearance: Normal appearance.  HENT:     Head: Normocephalic and atraumatic.     Mouth/Throat:     Mouth: Mucous membranes are moist.  Eyes:     Conjunctiva/sclera: Conjunctivae normal.  Cardiovascular:     Rate and Rhythm: Normal rate and regular rhythm.     Heart sounds: No murmur heard. Pulmonary:     Effort: Pulmonary effort is normal.     Breath sounds: Normal breath sounds.  Abdominal:     General: Abdomen is flat.     Palpations: Abdomen is soft.  Musculoskeletal:     Cervical back: Neck supple.     Comments: Moderate tenderness over the right patella with moderate swelling over the inferolateral aspect, no wounds, joint stable, no obvious deformity, range of motion limited secondary to pain, slight tenderness diffusely over ankle unable to pinpoint area of tenderness, no tenderness over foot, neurovascularly intact distally, no calf tenderness, Achilles appears intact, soft compartments to lower extremity, no tenderness over femur  Skin:    General: Skin is warm and dry.     Capillary Refill: Capillary refill takes less than 2 seconds.  Neurological:     Mental Status: She is alert. Mental  status is at baseline.  Psychiatric:        Behavior: Behavior normal.     ED Results / Procedures / Treatments   Labs (all labs ordered are listed, but only abnormal results are displayed) Labs Reviewed - No data to display  EKG None  Radiology DG Knee Complete 4 Views Right  Result Date: 11/10/2022 CLINICAL DATA:  Fall and trauma to the right lower extremity. EXAM: RIGHT KNEE - COMPLETE 4+ VIEW; RIGHT ANKLE - 2 VIEW COMPARISON:  None Available. FINDINGS: There is no acute fracture or dislocation. The bones are well mineralized. Minimal spurring of the knee. No joint effusion. The soft tissues are unremarkable.  IMPRESSION: No acute fracture or dislocation. Electronically Signed   By: Elgie Collard M.D.   On: 11/10/2022 20:35   DG Ankle 2 Views Right  Result Date: 11/10/2022 CLINICAL DATA:  Fall and trauma to the right lower extremity. EXAM: RIGHT KNEE - COMPLETE 4+ VIEW; RIGHT ANKLE - 2 VIEW COMPARISON:  None Available. FINDINGS: There is no acute fracture or dislocation. The bones are well mineralized. Minimal spurring of the knee. No joint effusion. The soft tissues are unremarkable. IMPRESSION: No acute fracture or dislocation. Electronically Signed   By: Elgie Collard M.D.   On: 11/10/2022 20:35    Procedures Procedures    Medications Ordered in ED Medications  ibuprofen (ADVIL) tablet 600 mg (600 mg Oral Given 11/10/22 2020)  oxyCODONE-acetaminophen (PERCOCET/ROXICET) 5-325 MG per tablet 2 tablet (1 tablet Oral Given 11/10/22 2021)  oxyCODONE-acetaminophen (PERCOCET/ROXICET) 5-325 MG per tablet 1 tablet (1 tablet Oral Given 11/10/22 2032)    ED Course/ Medical Decision Making/ A&P                             Medical Decision Making Amount and/or Complexity of Data Reviewed Radiology: ordered. Decision-making details documented in ED Course.  Risk OTC drugs. Prescription drug management.   Medical Decision Making:   Erin Gibbs is a 47 y.o. female who presented to the ED today with knee pain detailed above.    Patient's presentation is complicated by their history of previous knee injury.  Complete initial physical exam performed, notably the patient was with tenderness over patella and swelling to inferolateral knee but joint stable with soft compartments and neurovascularly intact distally.    Reviewed and confirmed nursing documentation for past medical history, family history, social history.    Initial Assessment:   With the patient's presentation, differential diagnosis includes but is not limited to sprain, strain, fracture, dislocation, DVT, compartment  syndrome, contusion.  This is most consistent with an acute complicated illness  Initial Plan:  Knee / ankle XR to assess for traumatic injury Symptomatic management (of note, one Percocet accidentally dropped by nursing staff thus disposed of and additional tablet ordered) Objective evaluation as below reviewed   Initial Study Results:   Radiology:  All images reviewed independently. Agree with radiology report at this time.   DG Knee Complete 4 Views Right  Result Date: 11/10/2022 CLINICAL DATA:  Fall and trauma to the right lower extremity. EXAM: RIGHT KNEE - COMPLETE 4+ VIEW; RIGHT ANKLE - 2 VIEW COMPARISON:  None Available. FINDINGS: There is no acute fracture or dislocation. The bones are well mineralized. Minimal spurring of the knee. No joint effusion. The soft tissues are unremarkable. IMPRESSION: No acute fracture or dislocation. Electronically Signed   By: Elgie Collard M.D.   On: 11/10/2022 20:35  DG Ankle 2 Views Right  Result Date: 11/10/2022 CLINICAL DATA:  Fall and trauma to the right lower extremity. EXAM: RIGHT KNEE - COMPLETE 4+ VIEW; RIGHT ANKLE - 2 VIEW COMPARISON:  None Available. FINDINGS: There is no acute fracture or dislocation. The bones are well mineralized. Minimal spurring of the knee. No joint effusion. The soft tissues are unremarkable. IMPRESSION: No acute fracture or dislocation. Electronically Signed   By: Elgie Collard M.D.   On: 11/10/2022 20:35      Final Assessment and Plan:   47 year old female presenting to the ED post mechanical fall for right knee pain.  On exam, she has tenderness over the patella with swelling to the anterior and lateral aspect of the knee but joint is stable.  Patient neurovascularly intact distally.  She has some somewhat diffuse tenderness over the ankle as well.  X-rays obtained.  No acute findings on either x-ray.  Patient with significant difficulty bearing weight on the lower extremity.  She does have a history of  previous ACL tear.  Will place in knee immobilizer and have follow-up closely with orthopedics.  Discussed this with patient who is agreeable.  Also provided with primary care follow-up as pt recently moved to town. PDMP reviewed and negative. Given severity of pt pain will give small amount of narcotic pain medication as needed if first line Tylenol/ibuprofen ineffective over next few days. Pt expressed understanding of plan. Strict ED return precautions given, all questions answered, and stable for discharge.    Clinical Impression:  1. Sprain of right knee, unspecified ligament, initial encounter   2. Fall, initial encounter      Discharge          Final Clinical Impression(s) / ED Diagnoses Final diagnoses:  Sprain of right knee, unspecified ligament, initial encounter  Fall, initial encounter    Rx / DC Orders ED Discharge Orders          Ordered    ibuprofen (ADVIL) 600 MG tablet  Every 6 hours PRN        11/10/22 2107    oxyCODONE (ROXICODONE) 5 MG immediate release tablet  Every 6 hours PRN        11/10/22 2107              Richardson Dopp 11/10/22 2110    Laurence Spates, MD 11/13/22 [phone]
